# Patient Record
Sex: Male | Born: 1985 | Race: Black or African American | Hispanic: No | Marital: Married | Smoking: Never smoker
Health system: Southern US, Community
[De-identification: ages and names within clinical notes are randomized; demographics above are authoritative.]

## PROBLEM LIST (undated history)

## (undated) DIAGNOSIS — R030 Elevated blood-pressure reading, without diagnosis of hypertension: Secondary | ICD-10-CM

## (undated) DIAGNOSIS — L659 Nonscarring hair loss, unspecified: Secondary | ICD-10-CM

## (undated) DIAGNOSIS — J45909 Unspecified asthma, uncomplicated: Secondary | ICD-10-CM

## (undated) HISTORY — PX: WISDOM TOOTH EXTRACTION: SHX21

## (undated) HISTORY — DX: Unspecified asthma, uncomplicated: J45.909

## (undated) HISTORY — DX: Elevated blood-pressure reading, without diagnosis of hypertension: R03.0

## (undated) HISTORY — DX: Nonscarring hair loss, unspecified: L65.9

---
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
## 2009-01-02 ENCOUNTER — Emergency Department: Payer: Self-pay | Admitting: Emergency Medicine

## 2013-10-09 ENCOUNTER — Ambulatory Visit: Payer: Self-pay | Admitting: Family Medicine

## 2013-11-02 ENCOUNTER — Ambulatory Visit: Payer: Self-pay | Admitting: Family Medicine

## 2013-12-03 ENCOUNTER — Ambulatory Visit: Payer: Self-pay | Admitting: Family Medicine

## 2014-01-18 ENCOUNTER — Emergency Department: Payer: Self-pay | Admitting: Emergency Medicine

## 2015-05-04 DIAGNOSIS — J45909 Unspecified asthma, uncomplicated: Secondary | ICD-10-CM | POA: Insufficient documentation

## 2015-05-05 ENCOUNTER — Encounter: Payer: Self-pay | Admitting: Family Medicine

## 2015-05-05 ENCOUNTER — Ambulatory Visit (INDEPENDENT_AMBULATORY_CARE_PROVIDER_SITE_OTHER): Payer: 59 | Admitting: Family Medicine

## 2015-05-05 VITALS — BP 102/71 | HR 78 | Temp 98.5°F | Ht 65.5 in | Wt 201.0 lb

## 2015-05-05 DIAGNOSIS — M25561 Pain in right knee: Secondary | ICD-10-CM

## 2015-05-05 DIAGNOSIS — J4520 Mild intermittent asthma, uncomplicated: Secondary | ICD-10-CM

## 2015-05-05 MED ORDER — MELOXICAM 15 MG PO TABS: 15.0000 mg | ORAL_TABLET | Freq: Every day | ORAL | Status: DC

## 2015-05-05 NOTE — Progress Notes (Signed)
   BP 102/71 mmHg  Pulse 78  Temp(Src) 98.5 F (36.9 C)  Ht 5' 5.5" (1.664 m)  Wt 201 lb (91.173 kg)  BMI 32.93 kg/m2  SpO2 99%   Subjective:    Patient ID: Brandon Marsh, male    DOB: [birth year], 29 y.o.   MRN: [id number]  HPI: Brandon RobinsonGregory A Marsh is a 29 y.o. male presenting on 05/05/2015 for Knee Pain  Knee Pain: Patient presents with knee pain involving the  left knee. Onset of the symptoms was a week ago. Inciting event: none known. Current symptoms include stiffness. Pain is aggravated by inactivity.  Patient has had prior knee problems.  Some locking and occ giving way esp after getting out of car Relevant past medical, surgical, family and social history reviewed and updated as indicated. Interim medical history since our last visit reviewed. Allergies and medications reviewed and updated.  Current Outpatient Prescriptions on File Prior to Visit  Medication Sig  . albuterol (PROVENTIL HFA;VENTOLIN HFA) 108 (90 BASE) MCG/ACT inhaler Inhale into the lungs every 6 (six) hours as needed for wheezing or shortness of breath.  . budesonide-formoterol (SYMBICORT) 160-4.5 MCG/ACT inhaler Inhale 2 puffs into the lungs 2 (two) times daily.  . salmeterol (SEREVENT) 50 MCG/DOSE diskus inhaler Inhale 1 puff into the lungs 2 (two) times daily.   No current facility-administered medications on file prior to visit.    Review of Systems  Constitutional: Negative.   Respiratory: Negative.   Cardiovascular: Negative.     Per HPI unless specifically indicated above     Objective:    BP 102/71 mmHg  Pulse 78  Temp(Src) 98.5 F (36.9 C)  Ht 5' 5.5" (1.664 m)  Wt 201 lb (91.173 kg)  BMI 32.93 kg/m2  SpO2 99%  Wt Readings from Last 3 Encounters:  05/05/15 201 lb (91.173 kg)  09/20/14 199 lb (90.266 kg)    Physical Exam  Constitutional: He is oriented to person, place, and time. He appears well-developed and well-nourished. No distress.  HENT:  Head: Normocephalic and  atraumatic.  Right Ear: Hearing normal.  Left Ear: Hearing normal.  Nose: Nose normal.  Eyes: Conjunctivae and lids are normal. Right eye exhibits no discharge. Left eye exhibits no discharge. No scleral icterus.  Pulmonary/Chest: Effort normal. No respiratory distress.  Musculoskeletal: Normal range of motion.       Right knee: He exhibits normal range of motion, no swelling, no effusion, no ecchymosis, no deformity, no erythema, normal alignment, no LCL laxity and normal patellar mobility. Tenderness found. No MCL and no LCL tenderness noted.       Legs: Neurological: He is alert and oriented to person, place, and time.  Skin: Skin is intact. No rash noted.  Psychiatric: He has a normal mood and affect. His speech is normal and behavior is normal. Judgment and thought content normal. Cognition and memory are normal.    No results found for this or any previous visit.    Assessment & Plan:   Problem List Items Addressed This Visit      Other   Knee pain, right - Primary       Meds ordered this encounter  Medications  . meloxicam (MOBIC) 15 MG tablet    Sig: Take 1 tablet (15 mg total) by mouth daily.    Dispense:  30 tablet    Refill:  2    Follow up plan: No Follow-up on file.

## 2015-05-05 NOTE — Assessment & Plan Note (Signed)
Will cont med as doing well

## 2015-05-05 NOTE — Assessment & Plan Note (Signed)
Discussed care and use of meds Slow down on activity If not better ortho referral

## 2015-09-05 ENCOUNTER — Encounter: Payer: 59 | Admitting: Family Medicine

## 2015-09-06 ENCOUNTER — Telehealth: Payer: Self-pay | Admitting: Family Medicine

## 2015-09-06 NOTE — Telephone Encounter (Signed)
Pt forgot about appt yesterday and came this morning to reschedule and now has an appt the 25th of October. He would like a refill on his symbicort and his his albuterol rescue inhaler sent to [facility]

## 2015-09-07 MED ORDER — ALBUTEROL SULFATE HFA 108 (90 BASE) MCG/ACT IN AERS: 1.0000 | INHALATION_SPRAY | Freq: Four times a day (QID) | RESPIRATORY_TRACT | Status: DC | PRN

## 2015-09-07 MED ORDER — BUDESONIDE-FORMOTEROL FUMARATE 160-4.5 MCG/ACT IN AERO: 2.0000 | INHALATION_SPRAY | Freq: Two times a day (BID) | RESPIRATORY_TRACT | Status: DC

## 2015-09-27 ENCOUNTER — Ambulatory Visit (INDEPENDENT_AMBULATORY_CARE_PROVIDER_SITE_OTHER): Payer: BLUE CROSS/BLUE SHIELD | Admitting: Family Medicine

## 2015-09-27 ENCOUNTER — Encounter: Payer: Self-pay | Admitting: Family Medicine

## 2015-09-27 VITALS — BP 103/71 | HR 85 | Temp 98.2°F | Ht 64.7 in | Wt 199.0 lb

## 2015-09-27 DIAGNOSIS — J4520 Mild intermittent asthma, uncomplicated: Secondary | ICD-10-CM

## 2015-09-27 DIAGNOSIS — Z0000 Encounter for general adult medical examination without abnormal findings: Secondary | ICD-10-CM

## 2015-09-27 NOTE — Progress Notes (Signed)
   BP 103/71 mmHg  Pulse 85  Temp(Src) 98.2 F (36.8 C)  Ht 5' 4.7" (1.643 m)  Wt 199 lb (90.266 kg)  BMI 33.44 kg/m2  SpO2 98%   Subjective:    Patient ID: Brandon Marsh, male    DOB: 1986/04/18, 29 y.o.   MRN: [id number]  HPI: Brandon Marsh is a 29 y.o. male  Chief Complaint  Patient presents with  . Annual Exam   Patient doing some running breathing is doing okay taking Symbicort every day Rare use of albuterol. Not able to lose weight weight same as it was a year ago. Knee is better with some stretching Occasionally knee pain takes occasional meloxicam. Relevant past medical, surgical, family and social history reviewed and updated as indicated. Interim medical history since our last visit reviewed. Allergies and medications reviewed and updated.  Review of Systems  Constitutional: Negative.   HENT: Negative.   Eyes: Negative.   Respiratory: Negative.   Cardiovascular: Negative.   Gastrointestinal: Negative.   Endocrine: Negative.   Genitourinary: Negative.   Musculoskeletal: Negative.   Skin: Negative.   Allergic/Immunologic: Negative.   Neurological: Negative.   Hematological: Negative.   Psychiatric/Behavioral: Negative.     Per HPI unless specifically indicated above     Objective:    BP 103/71 mmHg  Pulse 85  Temp(Src) 98.2 F (36.8 C)  Ht 5' 4.7" (1.643 m)  Wt 199 lb (90.266 kg)  BMI 33.44 kg/m2  SpO2 98%  Wt Readings from Last 3 Encounters:  09/27/15 199 lb (90.266 kg)  05/05/15 201 lb (91.173 kg)  09/20/14 199 lb (90.266 kg)    Physical Exam  Constitutional: He is oriented to person, place, and time. He appears well-developed and well-nourished.  HENT:  Head: Normocephalic.  Right Ear: External ear normal.  Left Ear: External ear normal.  Nose: Nose normal.  Eyes: Conjunctivae and EOM are normal. Pupils are equal, round, and reactive to light.  Neck: Normal range of motion. Neck supple. No thyromegaly present.   Cardiovascular: Normal rate, regular rhythm, normal heart sounds and intact distal pulses.   Pulmonary/Chest: Effort normal and breath sounds normal.  Abdominal: Soft. Bowel sounds are normal. There is no splenomegaly or hepatomegaly.  Genitourinary: Penis normal.  Musculoskeletal: Normal range of motion.  Lymphadenopathy:    He has no cervical adenopathy.  Neurological: He is alert and oriented to person, place, and time. He has normal reflexes.  Skin: Skin is warm and dry.  Psychiatric: He has a normal mood and affect. His behavior is normal. Judgment and thought content normal.    No results found for this or any previous visit.    Assessment & Plan:   Problem List Items Addressed This Visit      Respiratory   Asthma - Primary    The current medical regimen is effective;  continue present plan and medications.        Other Visit Diagnoses    PE (physical exam), annual        Relevant Orders    CBC with Differential/Platelet    Lipid panel    Comprehensive metabolic panel    Urinalysis, Routine w reflex microscopic (not at [facility])    TSH       Discussed today fast diet Follow up plan: Return in about 1 year (around 09/26/2016).

## 2015-09-27 NOTE — Assessment & Plan Note (Signed)
The current medical regimen is effective;  continue present plan and medications.  

## 2015-09-28 ENCOUNTER — Encounter: Payer: Self-pay | Admitting: Family Medicine

## 2015-09-28 LAB — CBC WITH DIFFERENTIAL/PLATELET
BASOS ABS: 0 10*3/uL (ref 0.0–0.2)
Basos: 0 %
EOS (ABSOLUTE): 0.5 10*3/uL — ABNORMAL HIGH (ref 0.0–0.4)
Eos: 5 %
Hematocrit: 45.9 % (ref 37.5–51.0)
Hemoglobin: 15.6 g/dL (ref 12.6–17.7)
Immature Grans (Abs): 0 10*3/uL (ref 0.0–0.1)
Immature Granulocytes: 0 %
LYMPHS ABS: 4.8 10*3/uL — AB (ref 0.7–3.1)
Lymphs: 49 %
MCH: 28.7 pg (ref 26.6–33.0)
MCHC: 34 g/dL (ref 31.5–35.7)
MCV: 84 fL (ref 79–97)
MONOS ABS: 0.7 10*3/uL (ref 0.1–0.9)
Monocytes: 7 %
Neutrophils Absolute: 3.8 10*3/uL (ref 1.4–7.0)
Neutrophils: 39 %
Platelets: 281 10*3/uL (ref 150–379)
RBC: 5.44 x10E6/uL (ref 4.14–5.80)
RDW: 14 % (ref 12.3–15.4)
WBC: 9.8 10*3/uL (ref 3.4–10.8)

## 2015-09-28 LAB — COMPREHENSIVE METABOLIC PANEL
Albumin/Globulin Ratio: 1.8 (ref 1.1–2.5)
Globulin, Total: 2.6 g/dL (ref 1.5–4.5)
TOTAL PROTEIN: 7.3 g/dL (ref 6.0–8.5)

## 2015-09-28 LAB — URINALYSIS, ROUTINE W REFLEX MICROSCOPIC
Bilirubin, UA: NEGATIVE
Glucose, UA: NEGATIVE
KETONES UA: NEGATIVE
LEUKOCYTES UA: NEGATIVE
Nitrite, UA: NEGATIVE
Protein, UA: NEGATIVE
SPEC GRAV UA: 1.025 (ref 1.005–1.030)
Urobilinogen, Ur: 0.2 mg/dL (ref 0.2–1.0)
pH, UA: 5.5 (ref 5.0–7.5)

## 2015-09-28 LAB — LIPID PANEL
CHOLESTEROL TOTAL: 202 mg/dL — AB (ref 100–199)
Chol/HDL Ratio: 4.8 ratio units (ref 0.0–5.0)
HDL: 42 mg/dL (ref >39)
LDL Calculated: 101 mg/dL — ABNORMAL HIGH (ref 0–99)
Triglycerides: 296 mg/dL — ABNORMAL HIGH (ref 0–149)

## 2015-09-28 LAB — MICROSCOPIC EXAMINATION
Epithelial Cells (non renal): NONE SEEN /hpf (ref 0–10)
RBC, UA: NONE SEEN /hpf (ref 0–?)
WBC, UA: NONE SEEN /hpf (ref 0–?)

## 2015-09-28 LAB — TSH: TSH: 1.84 u[IU]/mL (ref 0.450–4.500)

## 2016-03-21 ENCOUNTER — Ambulatory Visit (INDEPENDENT_AMBULATORY_CARE_PROVIDER_SITE_OTHER): Payer: 59 | Admitting: Family Medicine

## 2016-03-21 ENCOUNTER — Encounter: Payer: Self-pay | Admitting: Family Medicine

## 2016-03-21 VITALS — BP 126/84 | HR 81 | Temp 98.6°F | Ht 65.1 in | Wt 201.0 lb

## 2016-03-21 DIAGNOSIS — M25561 Pain in right knee: Secondary | ICD-10-CM

## 2016-03-21 DIAGNOSIS — J4520 Mild intermittent asthma, uncomplicated: Secondary | ICD-10-CM

## 2016-03-21 MED ORDER — MELOXICAM 15 MG PO TABS: 15.0000 mg | ORAL_TABLET | Freq: Every day | ORAL | Status: DC

## 2016-03-21 NOTE — Assessment & Plan Note (Signed)
The current medical regimen is effective;  continue present plan and medications.  

## 2016-03-21 NOTE — Progress Notes (Signed)
BP 126/84 mmHg  Pulse 81  Temp(Src) 98.6 F (37 C)  Ht 5' 5.1" (1.654 m)  Wt 201 lb (91.173 kg)  BMI 33.33 kg/m2  SpO2 99%   Subjective:    Patient ID: Brandon Marsh, male    DOB: 1986/06/29, 30 y.o.   MRN: [id number]  HPI: Brandon Marsh is a 30 y.o. male  Chief Complaint  Patient presents with  . Knee Pain    right  Almost a year ago patient first hurt his knee doing some trapeze fine on vacation. Knee is intermittently hurt with some clicking popping sensation no giving way no locking.HAs done well with occasional meloxicam . Knee will do well for some time and then spontaneously just wake up and his knees sore and limping. As tried to give it time and it just isn't getting better.  Taking Zyrtec-D for allergies is in Symbicort every day hasn't needed albuterol as well as doing well with good control here in this allergy season.   Relevant past medical, surgical, family and social history reviewed and updated as indicated. Interim medical history since our last visit reviewed. Allergies and medications reviewed and updated.  Review of Systems  Constitutional: Negative.   Respiratory: Negative.   Cardiovascular: Negative.     Per HPI unless specifically indicated above     Objective:    BP 126/84 mmHg  Pulse 81  Temp(Src) 98.6 F (37 C)  Ht 5' 5.1" (1.654 m)  Wt 201 lb (91.173 kg)  BMI 33.33 kg/m2  SpO2 99%  Wt Readings from Last 3 Encounters:  03/21/16 201 lb (91.173 kg)  09/27/15 199 lb (90.266 kg)  05/05/15 201 lb (91.173 kg)    Physical Exam  Constitutional: He is oriented to person, place, and time. He appears well-developed and well-nourished. No distress.  HENT:  Head: Normocephalic and atraumatic.  Right Ear: Hearing normal.  Left Ear: Hearing normal.  Nose: Nose normal.  Eyes: Conjunctivae and lids are normal. Right eye exhibits no discharge. Left eye exhibits no discharge. No scleral icterus.  Cardiovascular: Normal rate and normal  heart sounds.   Pulmonary/Chest: Effort normal and breath sounds normal. No respiratory distress.  Musculoskeletal: Normal range of motion.  Knee exam with no swelling no joint line tenderness noted joint laxity full range of motion  Neurological: He is alert and oriented to person, place, and time.  Skin: Skin is intact. No rash noted.  Psychiatric: He has a normal mood and affect. His speech is normal and behavior is normal. Judgment and thought content normal. Cognition and memory are normal.    Results for orders placed or performed in visit on 09/27/15  Microscopic Examination  Result Value Ref Range   WBC, UA None seen 0 -  5 /hpf   RBC, UA None seen 0 -  2 /hpf   Epithelial Cells (non renal) None seen 0 - 10 /hpf  CBC with Differential/Platelet  Result Value Ref Range   WBC 9.8 3.4 - 10.8 x10E3/uL   RBC 5.44 4.14 - 5.80 x10E6/uL   Hemoglobin 15.6 12.6 - 17.7 g/dL   Hematocrit 40.9 81.1 - 51.0 %   MCV 84 79 - 97 fL   MCH 28.7 26.6 - 33.0 pg   MCHC 34.0 31.5 - 35.7 g/dL   RDW 91.4 78.2 - 95.6 %   Platelets 281 150 - 379 x10E3/uL   Neutrophils 39 %   Lymphs 49 %   Monocytes 7 %   Eos 5 %  Basos 0 %   Neutrophils Absolute 3.8 1.4 - 7.0 x10E3/uL   Lymphocytes Absolute 4.8 (H) 0.7 - 3.1 x10E3/uL   Monocytes Absolute 0.7 0.1 - 0.9 x10E3/uL   EOS (ABSOLUTE) 0.5 (H) 0.0 - 0.4 x10E3/uL   Basophils Absolute 0.0 0.0 - 0.2 x10E3/uL   Immature Granulocytes 0 %   Immature Grans (Abs) 0.0 0.0 - 0.1 x10E3/uL  Lipid panel  Result Value Ref Range   Cholesterol, Total 202 (H) 100 - 199 mg/dL   Triglycerides 562296 (H) 0 - 149 mg/dL   HDL 42 >13>39 mg/dL   LDL Calculated 086101 (H) 0 - 99 mg/dL   Chol/HDL Ratio 4.8 0.0 - 5.0 ratio units  Comprehensive metabolic panel  Result Value Ref Range   Total Protein 7.3 6.0 - 8.5 g/dL   Globulin, Total 2.6 1.5 - 4.5 g/dL   Albumin/Globulin Ratio 1.8 1.1 - 2.5  Urinalysis, Routine w reflex microscopic (not at [facility])  Result Value Ref Range    Specific Gravity, UA 1.025 1.005 - 1.030   pH, UA 5.5 5.0 - 7.5   Color, UA Yellow Yellow   Appearance Ur Clear Clear   Leukocytes, UA Negative Negative   Protein, UA Negative Negative/Trace   Glucose, UA Negative Negative   Ketones, UA Negative Negative   RBC, UA Trace (A) Negative   Bilirubin, UA Negative Negative   Urobilinogen, Ur 0.2 0.2 - 1.0 mg/dL   Nitrite, UA Negative Negative   Microscopic Examination See below:   TSH  Result Value Ref Range   TSH 1.840 0.450 - 4.500 uIU/mL      Assessment & Plan:   Problem List Items Addressed This Visit      Respiratory   Asthma - Primary    The current medical regimen is effective;  continue present plan and medications.         Other   Knee pain, right    Possible torn cartilage causing recurrent prolonged symptoms will refer to orthopedics to further evaluate      Relevant Medications   meloxicam (MOBIC) 15 MG tablet   Other Relevant Orders   Ambulatory referral to Orthopedic Surgery       Follow up plan: Return in about 6 months (around 09/20/2016) for Physical Exam.

## 2016-03-21 NOTE — Assessment & Plan Note (Signed)
Possible torn cartilage causing recurrent prolonged symptoms will refer to orthopedics to further evaluate

## 2016-07-02 ENCOUNTER — Other Ambulatory Visit: Payer: Self-pay | Admitting: Family Medicine

## 2016-08-17 ENCOUNTER — Encounter: Payer: Self-pay | Admitting: Family Medicine

## 2016-09-27 ENCOUNTER — Encounter: Payer: 59 | Admitting: Family Medicine

## 2017-05-15 ENCOUNTER — Other Ambulatory Visit: Payer: Self-pay | Admitting: Family Medicine

## 2017-05-15 DIAGNOSIS — M25561 Pain in right knee: Secondary | ICD-10-CM

## 2017-05-15 NOTE — Telephone Encounter (Signed)
Call pt, Met him know it's been over a year from his last office visit and we are unable to call in any medication. He can take over-the-counter meloxicam like medication such as Advil or Aleve. Otherwise he will need to make an appointment. Someone in this office other than me can see him this week

## 2017-05-15 NOTE — Telephone Encounter (Signed)
Left message on machine for pt to return call to the office.  

## 2017-08-19 ENCOUNTER — Other Ambulatory Visit: Payer: Self-pay | Admitting: Family Medicine

## 2017-08-19 NOTE — Telephone Encounter (Signed)
Routing to provider. No follow up on file. 

## 2017-10-09 ENCOUNTER — Encounter: Payer: Self-pay | Admitting: Family Medicine

## 2017-10-09 ENCOUNTER — Ambulatory Visit: Payer: BC Managed Care – PPO | Admitting: Family Medicine

## 2017-10-09 VITALS — BP 158/84 | HR 100 | Temp 98.8°F | Wt 194.0 lb

## 2017-10-09 DIAGNOSIS — J4521 Mild intermittent asthma with (acute) exacerbation: Secondary | ICD-10-CM | POA: Diagnosis not present

## 2017-10-09 MED ORDER — PREDNISONE 10 MG PO TABS: ORAL_TABLET | ORAL | 0 refills | Status: DC

## 2017-10-09 MED ORDER — CETIRIZINE-PSEUDOEPHEDRINE ER 5-120 MG PO TB12: 1.0000 | ORAL_TABLET | Freq: Two times a day (BID) | ORAL | 11 refills | Status: DC

## 2017-10-09 MED ORDER — BUDESONIDE-FORMOTEROL FUMARATE 160-4.5 MCG/ACT IN AERO: 2.0000 | INHALATION_SPRAY | Freq: Two times a day (BID) | RESPIRATORY_TRACT | 12 refills | Status: DC

## 2017-10-09 MED ORDER — AZITHROMYCIN 250 MG PO TABS: ORAL_TABLET | ORAL | 0 refills | Status: DC

## 2017-10-09 NOTE — Progress Notes (Signed)
   BP (!) 158/84   Pulse 100   Temp 98.8 F (37.1 C) (Oral)   Wt 194 lb (88 kg)   SpO2 95%   BMI 32.18 kg/m    Subjective:    Patient ID: Brandon Marsh, male    DOB: 09-05-1986, 31 y.o.   MRN: [id number]  HPI: Brandon RobinsonGregory A Tabb is a 31 y.o. male  Chief Complaint  Patient presents with  . URI    Beginning of October, worse at night.   . Sore Throat    Oct. 30th   Intermittent cough x 1 month, 2 weeks now of persistent productive cough and sore throat. Keeping him up at night. Does have a hx of asthma. Taking symbicort daily, albuterol prn. Also taking mucinex which helped temporarily. Denies fevers, chills, body aches.   Relevant past medical, surgical, family and social history reviewed and updated as indicated. Interim medical history since our last visit reviewed. Allergies and medications reviewed and updated.  Review of Systems  Constitutional: Negative.   HENT: Positive for congestion and sore throat.   Eyes: Negative.   Respiratory: Positive for cough, chest tightness and wheezing.   Cardiovascular: Negative.   Gastrointestinal: Negative.   Musculoskeletal: Negative.   Neurological: Negative.   Psychiatric/Behavioral: Negative.     Per HPI unless specifically indicated above     Objective:    BP (!) 158/84   Pulse 100   Temp 98.8 F (37.1 C) (Oral)   Wt 194 lb (88 kg)   SpO2 95%   BMI 32.18 kg/m   Wt Readings from Last 3 Encounters:  10/09/17 194 lb (88 kg)  03/21/16 201 lb (91.2 kg)  09/27/15 199 lb (90.3 kg)    Physical Exam  Constitutional: He is oriented to person, place, and time. He appears well-developed and well-nourished. No distress.  HENT:  Head: Atraumatic.  Right Ear: External ear normal.  Left Ear: External ear normal.  Nose: Nose normal.  Mouth/Throat: No oropharyngeal exudate.  Oropharynx erythematous without exudates  Eyes: Conjunctivae are normal. Pupils are equal, round, and reactive to light.  Neck: Normal range of  motion. Neck supple.  Cardiovascular: Normal rate and normal heart sounds.  Pulmonary/Chest: Effort normal. No respiratory distress. He has wheezes. He has no rales.  Musculoskeletal: Normal range of motion.  Neurological: He is alert and oriented to person, place, and time.  Skin: Skin is warm and dry.  Psychiatric: He has a normal mood and affect. His behavior is normal.  Nursing note and vitals reviewed.     Assessment & Plan:   Problem List Items Addressed This Visit      Respiratory   Asthma - Primary    Will start zpack, prednisone taper, and daily zyrtec D to keep allergies at bay. Continue inhaler regimen. F/u if worsening or no improvement.       Relevant Medications   budesonide-formoterol (SYMBICORT) 160-4.5 MCG/ACT inhaler   predniSONE (DELTASONE) 10 MG tablet       Follow up plan: Return if symptoms worsen or fail to improve.

## 2017-10-11 NOTE — Assessment & Plan Note (Signed)
Will start zpack, prednisone taper, and daily zyrtec D to keep allergies at bay. Continue inhaler regimen. F/u if worsening or no improvement.

## 2017-10-11 NOTE — Patient Instructions (Signed)
Follow up as needed

## 2018-06-19 ENCOUNTER — Other Ambulatory Visit: Payer: Self-pay | Admitting: Family Medicine

## 2018-06-19 NOTE — Telephone Encounter (Signed)
Your patient 

## 2018-09-06 ENCOUNTER — Other Ambulatory Visit: Payer: Self-pay | Admitting: Family Medicine

## 2018-09-08 NOTE — Telephone Encounter (Signed)
[facility] called and spoke to Toniann Fail, Pensions consultant about the [employer] inhaler. I advised the message on the rx request protocol that says:  One inhaler should last at least one month. If the patient is requesting refills earlier, contact the patient to check for uncontrolled symptoms.  Toniann Fail says the patient received 1 inhaler last in July. Medication will be refilled.

## 2018-10-08 ENCOUNTER — Encounter: Payer: Self-pay | Admitting: Family Medicine

## 2018-10-08 ENCOUNTER — Ambulatory Visit: Payer: BC Managed Care – PPO | Admitting: Family Medicine

## 2018-10-08 VITALS — BP 112/81 | HR 82 | Temp 99.3°F | Ht 66.0 in | Wt 216.7 lb

## 2018-10-08 DIAGNOSIS — J029 Acute pharyngitis, unspecified: Secondary | ICD-10-CM

## 2018-10-08 DIAGNOSIS — R52 Pain, unspecified: Secondary | ICD-10-CM | POA: Diagnosis not present

## 2018-10-08 DIAGNOSIS — J019 Acute sinusitis, unspecified: Secondary | ICD-10-CM | POA: Diagnosis not present

## 2018-10-08 MED ORDER — AMOXICILLIN 875 MG PO TABS: 875.0000 mg | ORAL_TABLET | Freq: Two times a day (BID) | ORAL | 0 refills | Status: DC

## 2018-10-08 NOTE — Progress Notes (Signed)
BP 112/81 (BP Location: Left Arm, Patient Position: Sitting, Cuff Size: Normal)   Pulse 82   Temp 99.3 F (37.4 C) (Oral)   Ht 5\' 6"  (1.676 m)   Wt 216 lb 11.2 oz (98.3 kg)   SpO2 97%   BMI 34.98 kg/m    Subjective:    Patient ID: Hubbard Robinson, male    DOB: 11/02/1986, 31 y.o.   MRN: [id number]  HPI: MUATH HALLAM is a 32 y.o. male  Chief Complaint  Patient presents with  . Cough    Productive. Patient states he's coughing up green/blood.  . Sore Throat  . Nasal Congestion  Patient's been sick with sinus congestion facial pain tenderness sloshing type sensation in his head for up to a week.  Said some low-grade fevers generalized achiness and feeling bad.  Has tried some over-the-counter medications without success  Relevant past medical, surgical, family and social history reviewed and updated as indicated. Interim medical history since our last visit reviewed. Allergies and medications reviewed and updated.  Review of Systems  Constitutional: Negative.   Respiratory: Negative.   Cardiovascular: Negative.     Per HPI unless specifically indicated above     Objective:    BP 112/81 (BP Location: Left Arm, Patient Position: Sitting, Cuff Size: Normal)   Pulse 82   Temp 99.3 F (37.4 C) (Oral)   Ht 5\' 6"  (1.676 m)   Wt 216 lb 11.2 oz (98.3 kg)   SpO2 97%   BMI 34.98 kg/m   Wt Readings from Last 3 Encounters:  10/08/18 216 lb 11.2 oz (98.3 kg)  10/09/17 194 lb (88 kg)  03/21/16 201 lb (91.2 kg)    Physical Exam  Constitutional: He is oriented to person, place, and time. He appears well-developed and well-nourished.  HENT:  Head: Normocephalic and atraumatic.  Eyes: Conjunctivae and EOM are normal.  Neck: Normal range of motion.  Cardiovascular: Normal rate, regular rhythm and normal heart sounds.  Pulmonary/Chest: Effort normal and breath sounds normal.  Musculoskeletal: Normal range of motion.  Neurological: He is alert and oriented to person,  place, and time.  Skin: No erythema.  Psychiatric: He has a normal mood and affect. His behavior is normal. Judgment and thought content normal.    Results for orders placed or performed in visit on 09/27/15  Microscopic Examination  Result Value Ref Range   WBC, UA None seen 0 - 5 /hpf   RBC, UA None seen 0 - 2 /hpf   Epithelial Cells (non renal) None seen 0 - 10 /hpf  CBC with Differential/Platelet  Result Value Ref Range   WBC 9.8 3.4 - 10.8 x10E3/uL   RBC 5.44 4.14 - 5.80 x10E6/uL   Hemoglobin 15.6 12.6 - 17.7 g/dL   Hematocrit 40.9 81.1 - 51.0 %   MCV 84 79 - 97 fL   MCH 28.7 26.6 - 33.0 pg   MCHC 34.0 31.5 - 35.7 g/dL   RDW 91.4 78.2 - 95.6 %   Platelets 281 150 - 379 x10E3/uL   Neutrophils 39 %   Lymphs 49 %   Monocytes 7 %   Eos 5 %   Basos 0 %   Neutrophils Absolute 3.8 1.4 - 7.0 x10E3/uL   Lymphocytes Absolute 4.8 (H) 0.7 - 3.1 x10E3/uL   Monocytes Absolute 0.7 0.1 - 0.9 x10E3/uL   EOS (ABSOLUTE) 0.5 (H) 0.0 - 0.4 x10E3/uL   Basophils Absolute 0.0 0.0 - 0.2 x10E3/uL   Immature Granulocytes 0 %  Immature Grans (Abs) 0.0 0.0 - 0.1 x10E3/uL  Lipid panel  Result Value Ref Range   Cholesterol, Total 202 (H) 100 - 199 mg/dL   Triglycerides 696 (H) 0 - 149 mg/dL   HDL 42 >29 mg/dL   LDL Calculated 528 (H) 0 - 99 mg/dL   Chol/HDL Ratio 4.8 0.0 - 5.0 ratio units  Comprehensive metabolic panel  Result Value Ref Range   Total Protein 7.3 6.0 - 8.5 g/dL   Globulin, Total 2.6 1.5 - 4.5 g/dL   Albumin/Globulin Ratio 1.8 1.1 - 2.5  Urinalysis, Routine w reflex microscopic (not at [facility])  Result Value Ref Range   Specific Gravity, UA 1.025 1.005 - 1.030   pH, UA 5.5 5.0 - 7.5   Color, UA Yellow Yellow   Appearance Ur Clear Clear   Leukocytes, UA Negative Negative   Protein, UA Negative Negative/Trace   Glucose, UA Negative Negative   Ketones, UA Negative Negative   RBC, UA Trace (A) Negative   Bilirubin, UA Negative Negative   Urobilinogen, Ur 0.2 0.2 - 1.0 mg/dL     Nitrite, UA Negative Negative   Microscopic Examination See below:   TSH  Result Value Ref Range   TSH 1.840 0.450 - 4.500 uIU/mL      Assessment & Plan:   Problem List Items Addressed This Visit    None    Visit Diagnoses    Sore throat    -  Primary   Relevant Orders   Rapid Strep Screen ([facility] ONLY)   Generalized body aches       Relevant Orders   Veritor Flu A/B Waived   Acute sinusitis, recurrence not specified, unspecified location       Relevant Medications   amoxicillin (AMOXIL) 875 MG tablet    Patient with sinusitis flu test and strep test were negative reviewed sinusitis care and treatment use of antibiotics and over-the-counter medications patient indicates he understands and will comply.  Discussed back to work and avoiding back to work until Monday as today is Wednesday. Follow up plan: Return if symptoms worsen or fail to improve, for As scheduled.

## 2018-10-13 ENCOUNTER — Ambulatory Visit: Payer: BC Managed Care – PPO | Admitting: Family Medicine

## 2018-10-13 ENCOUNTER — Encounter: Payer: Self-pay | Admitting: Family Medicine

## 2018-10-13 VITALS — BP 130/88 | HR 90 | Temp 99.0°F | Ht 65.0 in | Wt 216.0 lb

## 2018-10-13 DIAGNOSIS — J4521 Mild intermittent asthma with (acute) exacerbation: Secondary | ICD-10-CM

## 2018-10-13 DIAGNOSIS — J019 Acute sinusitis, unspecified: Secondary | ICD-10-CM | POA: Diagnosis not present

## 2018-10-13 DIAGNOSIS — E559 Vitamin D deficiency, unspecified: Secondary | ICD-10-CM

## 2018-10-13 DIAGNOSIS — Z6835 Body mass index (BMI) 35.0-35.9, adult: Secondary | ICD-10-CM | POA: Diagnosis not present

## 2018-10-13 LAB — CBC WITH DIFFERENTIAL/PLATELET
BASOS ABS: 0.1 10*3/uL (ref 0.0–0.1)
Basophils Relative: 0.7 % (ref 0.0–3.0)
EOS ABS: 0.4 10*3/uL (ref 0.0–0.7)
Eosinophils Relative: 4.6 % (ref 0.0–5.0)
HCT: 45.9 % (ref 39.0–52.0)
Hemoglobin: 15.7 g/dL (ref 13.0–17.0)
LYMPHS ABS: 4.2 10*3/uL — AB (ref 0.7–4.0)
LYMPHS PCT: 44.6 % (ref 12.0–46.0)
MCHC: 34.3 g/dL (ref 30.0–36.0)
MCV: 82.8 fl (ref 78.0–100.0)
MONO ABS: 0.6 10*3/uL (ref 0.1–1.0)
Monocytes Relative: 6.7 % (ref 3.0–12.0)
NEUTROS ABS: 4.1 10*3/uL (ref 1.4–7.7)
NEUTROS PCT: 43.4 % (ref 43.0–77.0)
PLATELETS: 304 10*3/uL (ref 150.0–400.0)
RBC: 5.54 Mil/uL (ref 4.22–5.81)
RDW: 13.2 % (ref 11.5–15.5)
WBC: 9.4 10*3/uL (ref 4.0–10.5)

## 2018-10-13 LAB — COMPREHENSIVE METABOLIC PANEL
ALBUMIN: 4.6 g/dL (ref 3.5–5.2)
ALK PHOS: 87 U/L (ref 39–117)
ALT: 42 U/L (ref 0–53)
AST: 25 U/L (ref 0–37)
BILIRUBIN TOTAL: 0.5 mg/dL (ref 0.2–1.2)
BUN: 8 mg/dL (ref 6–23)
CO2: 30 mEq/L (ref 19–32)
Calcium: 10.2 mg/dL (ref 8.4–10.5)
Chloride: 99 mEq/L (ref 96–112)
Creatinine, Ser: 0.99 mg/dL (ref 0.40–1.50)
GFR: 113.48 mL/min (ref >60.00)
Glucose, Bld: 93 mg/dL (ref 70–99)
Potassium: 4.4 mEq/L (ref 3.5–5.1)
SODIUM: 138 meq/L (ref 135–145)
Total Protein: 7.5 g/dL (ref 6.0–8.3)

## 2018-10-13 LAB — LIPID PANEL
CHOLESTEROL: 225 mg/dL — AB (ref 0–200)
HDL: 37.9 mg/dL — ABNORMAL LOW (ref >39.00)
Total CHOL/HDL Ratio: 6

## 2018-10-13 LAB — B12 AND FOLATE PANEL
Folate: 18.9 ng/mL (ref >5.9)
Vitamin B-12: 818 pg/mL (ref 211–911)

## 2018-10-13 LAB — TSH: TSH: 1.52 u[IU]/mL (ref 0.35–4.50)

## 2018-10-13 LAB — LDL CHOLESTEROL, DIRECT: LDL DIRECT: 123 mg/dL

## 2018-10-13 LAB — VITAMIN D 25 HYDROXY (VIT D DEFICIENCY, FRACTURES): VITD: 20.93 ng/mL — ABNORMAL LOW (ref 30.00–100.00)

## 2018-10-13 MED ORDER — PHENTERMINE HCL 30 MG PO CAPS: 30.0000 mg | ORAL_CAPSULE | ORAL | 0 refills | Status: DC

## 2018-10-13 NOTE — Progress Notes (Signed)
Subjective:    Patient ID: Brandon Marsh, male    DOB: September 01, 1986, 32 y.o.   MRN: [id number]  HPI  Patient presents to clinic to establish with PCP.  His main concern today is weight loss.  Patient states he was doing a weight loss program a couple of years ago, but it became too expensive to go to the specialty visits.  Patient is interested in taking a weight loss supplement for the first few months to help jumpstart his weight loss, and also will do diet exercise.  Patient states he notices when he is heavier, his breathing is worse.  He has a history of asthma.  He takes albuterol as needed and also Symbicort daily for stabilization.  Denies any breathing issues currently. He is currently on antibiotics to treat sinus infection.  Patient states his cold/sinus symptoms have improved since being on the antibiotic, first started on 10/08/2018.  Past Medical History:  Diagnosis Date  . Alopecia   . Asthma   . Elevated blood pressure reading without diagnosis of hypertension    Social History   Tobacco Use  . Smoking status: Never Smoker  . Smokeless tobacco: Never Used  Substance Use Topics  . Alcohol use: No    Alcohol/week: 0.0 standard drinks   Past Surgical History:  Procedure Laterality Date  . WISDOM TOOTH EXTRACTION     Family History  Problem Relation Age of Onset  . Asthma Father   . Hypertension Mother   . Diabetes Maternal Grandmother   . Hypertension Maternal Grandmother    Review of Systems  Constitutional: Negative for chills, fatigue and fever.  HENT: Negative for congestion, ear pain, sinus pain and sore throat.   Eyes: Negative.   Respiratory: Negative for cough, shortness of breath and wheezing.   Cardiovascular: Negative for chest pain, palpitations and leg swelling.  Gastrointestinal: Negative for abdominal pain, diarrhea, nausea and vomiting.  Genitourinary: Negative for dysuria, frequency and urgency.  Musculoskeletal: Negative for  arthralgias and myalgias.  Skin: Negative for color change, pallor and rash.  Neurological: Negative for syncope, light-headedness and headaches.  Psychiatric/Behavioral: The patient is not nervous/anxious.       Objective:   Physical Exam  Constitutional: He appears well-developed and well-nourished. No distress.  HENT:  Head: Normocephalic and atraumatic.  Eyes: Pupils are equal, round, and reactive to light. Conjunctivae and EOM are normal. No scleral icterus.  Neck: Normal range of motion. Neck supple. No tracheal deviation present.  Cardiovascular: Normal rate, regular rhythm and normal heart sounds.  Pulmonary/Chest: Effort normal and breath sounds normal. No respiratory distress. He has no wheezes. He has no rales.  Abdominal: Soft. Bowel sounds are normal. There is no tenderness.  Neurological: He is alert and oriented to person, place, and time. Gait normal  Skin: Skin is warm and dry. He is not diaphoretic. No pallor.  Psychiatric: He has a normal mood and affect. His behavior is normal. Thought content normal.   Nursing note and vitals reviewed.  Body mass index is 35.94 kg/m.   Vitals:   10/13/18 1021  BP: 130/88  Pulse: 90  Temp: 99 F (37.2 C)  SpO2: 97%      Assessment & Plan:   Obesity - discussed with patient that we can do phentermine for 3 months to help kick start weight loss.  Advised he must also do healthy diet and regular exercise while taking this medication, so he can continue to maintain good diet and regular exercise  after medication stops.  Patient aware that phentermine is a stimulant, advised to monitor self for any symptoms including racing heart, palpitations, chest pain, feeling short of breath.  Patient given handout outlining a lower carb, higher protein higher patient will diet plan that gives made her food choices that can be helpful to assist him in his weight loss journey.  Mild intermittent asthma without acute exacerbation  - asthma  stable at this time.  Currently does not require refills of Symbicort L or albuterol  Sinusitis-sinusitis symptoms are resolving.  Patient advised to finish full antibiotic course as prescribed.   Follow-up here in 1 month for monitoring of weight while on phentermine.

## 2018-10-13 NOTE — Patient Instructions (Signed)
This is  Dr. Tullo's  example of a  "Low GI"  Diet:  It will allow you to lose 4 to 8  lbs  per month if you follow it carefully.  Your goal with exercise is a minimum of 30 minutes of aerobic exercise 5 days per week (Walking does not count once it becomes easy!)    All of the foods can be found at grocery stores and in bulk at [employer].  The Atkins protein bars and shakes are available in more varieties at Target, WalMart and Lowe's Foods.     7 AM Breakfast:  Choose from the following:  Low carbohydrate Protein  Shakes (I recommend the  Premier Protein chocolate shakes,  EAS AdvantEdge "Carb Control" shakes  Or the Atkins shakes all are under 3 net carbs)     a scrambled egg/bacon/cheese burrito made with Mission's "carb balance" whole wheat tortilla  (about 10 net carbs )  Jimmy Deans sells microwaveable frittata (basically a quiche without the pastry crust) that is eaten cold and very convenient way to get your eggs.  8 carbs)  If you make your own protein shakes, avoid bananas and pineapple,  And use low carb greek yogurt or original /unsweetened almond or soy milk    Avoid cereal and bananas, oatmeal and cream of wheat and grits. They are loaded with carbohydrates!   10 AM: high protein snack:  Protein bar by Atkins (the snack size, under 200 cal, usually < 6 net carbs).    A stick of cheese:  Around 1 carb,  100 cal     Dannon Light n Fit Greek Yogurt  (80 cal, 8 carbs)  Other so called "protein bars" and Greek yogurts tend to be loaded with carbohydrates.  Remember, in food advertising, the word "energy" is synonymous for " carbohydrate."  Lunch:   A Sandwich using the bread choices listed, Can use any  Eggs,  lunchmeat, grilled meat or canned tuna), avocado, regular mayo/mustard  and cheese.  A Salad using blue cheese, ranch,  Goddess or vinagrette,  Avoid taco shells, croutons or "confetti" and no "candied nuts" but regular nuts OK.   No pretzels, nabs  or chips.  Pickles and  miniature sweet peppers are a good low carb alternative that provide a "crunch"  The bread is the only source of carbohydrate in a sandwich and  can be decreased by trying some of the attached alternatives to traditional loaf bread   Avoid "Low fat dressings, as well as [place] and [place] dressings They are loaded with sugar!   3 PM/ Mid day  Snack:  Consider  1 ounce of  almonds, walnuts, pistachios, pecans, peanuts,  Macadamia nuts or a nut medley.  Avoid "granola and granola bars "  Mixed nuts are ok in moderation as long as there are no raisins,  cranberries or dried fruit.   KIND bars are OK if you get the low glycemic index variety   Try the prosciutto/mozzarella cheese sticks by Fiorruci  In deli /backery section   High protein      6 PM  Dinner:     Meat/fowl/fish with a green salad, and either broccoli, cauliflower, green beans, spinach, brussel sprouts or  Lima beans. DO NOT BREAD THE PROTEIN!!      There is a low carb pasta by Dreamfield's that is acceptable and tastes great: only 5 digestible carbs/serving.( All grocery stores but BJs carry it ) Several ready made meals are   available low carb:   Try Michel Angelo's chicken piccata or chicken or eggplant parm over low carb pasta.(Lowes and BJs)   Aaron Sanchez's "Carnitas" (pulled pork, no sauce,  0 carbs) or his beef pot roast to make a dinner burrito (at BJ's)  Pesto over low carb pasta (bj's sells a good quality pesto in the center refrigerated section of the deli   Try satueeing  Bok Choy with mushroooms as a good side   Green Giant makes a mashed cauliflower that tastes like mashed potatoes  Whole wheat pasta is still full of digestible carbs and  Not as low in glycemic index as Dreamfield's.   Brown rice is still rice,  So skip the rice and noodles if you eat Chinese or Thai (or at least limit to 1/2 cup)  9 PM snack :   Breyer's "low carb" fudgsicle or  ice cream bar (Carb Smart line), or  Weight Watcher's ice  cream bar , or another "no sugar added" ice cream;  a serving of fresh berries/cherries with whipped cream   Cheese or DANNON'S LlGHT N FIT GREEK YOGURT  8 ounces of [place] unsweetened almond/cococunut milk    Treat yourself to a parfait made with whipped cream blueberiies, walnuts and vanilla greek yogurt  Avoid bananas, pineapple, grapes  and watermelon on a regular basis because they are high in sugar.  THINK OF THEM AS DESSERT  Remember that snack Substitutions should be less than 10 NET carbs per serving and meals < 20 carbs. Remember to subtract fiber grams to get the "net carbs."   

## 2018-10-16 MED ORDER — ERGOCALCIFEROL 50 MCG (2000 UT) PO TABS: 1.0000 | ORAL_TABLET | Freq: Every day | ORAL | 1 refills | Status: DC

## 2018-10-16 NOTE — Addendum Note (Signed)
Addended by: Leanora CoverGUSE,  on: 10/16/2018 01:10 PM   Modules accepted: Orders

## 2018-11-28 ENCOUNTER — Other Ambulatory Visit: Payer: Self-pay | Admitting: Family Medicine

## 2018-11-28 DIAGNOSIS — Z6835 Body mass index (BMI) 35.0-35.9, adult: Principal | ICD-10-CM

## 2018-12-08 NOTE — Telephone Encounter (Signed)
He needs follow up visit for weight check before we can do refill

## 2019-02-16 ENCOUNTER — Other Ambulatory Visit: Payer: Self-pay | Admitting: Family Medicine

## 2019-02-16 NOTE — Telephone Encounter (Signed)
Copied from CRM [id number]. Topic: Quick Communication - Rx Refill/Question >> Feb 16, 2019  4:45 PM Jolayne Haines L wrote: Medication: albuterol (PROVENTIL HFA;VENTOLIN HFA) 108 (90 Base) MCG/ACT inhaler - would like the name brand because the secondary brand makes him cough and makes his throat itch  Has the patient contacted their pharmacy? Yes (Agent: If no, request that the patient contact the pharmacy for the refill.) (Agent: If yes, when and what did the pharmacy advise?)  Preferred Pharmacy (with phone number or street name): [id number] [place], [place] - [address] AT [facility] OF [address] [facility] [address] [place] [address] Phone: [phone] Fax: [phone]    Agent: Please be advised that RX refills may take up to 3 business days. We ask that you follow-up with your pharmacy.

## 2019-02-17 NOTE — Telephone Encounter (Signed)
Please see patients request for Brand name instead of generic

## 2019-02-23 ENCOUNTER — Other Ambulatory Visit: Payer: Self-pay | Admitting: Family Medicine

## 2019-02-23 MED ORDER — ALBUTEROL SULFATE HFA 108 (90 BASE) MCG/ACT IN AERS: INHALATION_SPRAY | RESPIRATORY_TRACT | 0 refills | Status: DC

## 2019-02-27 ENCOUNTER — Other Ambulatory Visit: Payer: Self-pay

## 2019-02-27 MED ORDER — ALBUTEROL SULFATE HFA 108 (90 BASE) MCG/ACT IN AERS: INHALATION_SPRAY | RESPIRATORY_TRACT | 0 refills | Status: DC

## 2019-02-27 MED ORDER — BUDESONIDE-FORMOTEROL FUMARATE 160-4.5 MCG/ACT IN AERO: 2.0000 | INHALATION_SPRAY | Freq: Two times a day (BID) | RESPIRATORY_TRACT | 12 refills | Status: DC

## 2019-02-27 NOTE — Telephone Encounter (Signed)
Called and informed pt that he needs to f/up with you before he gets anymore of the diet medication, pt asked could he tell you his weight over the phone, I informed him that he needed to be seen so you can see how he is doing, but plase advise if something different he stated because of his asthma he did not want to come in but he would cal back later on to schedule.  Nina,cma

## 2019-02-27 NOTE — Telephone Encounter (Signed)
He was last seen in November 2019 and did not follow up as advised.    I will not refill this until he follows up.

## 2019-04-07 ENCOUNTER — Telehealth: Payer: Self-pay | Admitting: Family Medicine

## 2019-04-07 ENCOUNTER — Other Ambulatory Visit: Payer: Self-pay

## 2019-04-07 ENCOUNTER — Ambulatory Visit (INDEPENDENT_AMBULATORY_CARE_PROVIDER_SITE_OTHER): Payer: BLUE CROSS/BLUE SHIELD | Admitting: Family Medicine

## 2019-04-07 VITALS — Wt 223.0 lb

## 2019-04-07 DIAGNOSIS — F524 Premature ejaculation: Secondary | ICD-10-CM

## 2019-04-07 DIAGNOSIS — Z713 Dietary counseling and surveillance: Secondary | ICD-10-CM | POA: Diagnosis not present

## 2019-04-07 DIAGNOSIS — Z6835 Body mass index (BMI) 35.0-35.9, adult: Secondary | ICD-10-CM | POA: Diagnosis not present

## 2019-04-07 MED ORDER — PHENTERMINE HCL 30 MG PO CAPS: 30.0000 mg | ORAL_CAPSULE | ORAL | 0 refills | Status: DC

## 2019-04-07 NOTE — Telephone Encounter (Signed)
Please schedule him for labs on 04/14/2019 at 930 AM  And schedule 4 week follow up with me

## 2019-04-07 NOTE — Progress Notes (Signed)
Patient ID: Brandon Marsh, male   DOB: 31-Aug-1986, 33 y.o.   MRN: [id number]    Virtual Visit via video Note  This visit type was conducted due to national recommendations for restrictions regarding the COVID-19 pandemic (e.g. social distancing).  This format is felt to be most appropriate for this patient at this time.  All issues noted in this document were discussed and addressed.  No physical exam was performed (except for noted visual exam findings with Video Visits).   I connected with Suezanne Jacquet today at 11:00 AM EDT by a video enabled telemedicine application and verified that I am speaking with the correct person using two identifiers. Location patient: home Location provider: [facility] Persons participating in the virtual visit: patient, provider  I discussed the limitations, risks, security and privacy concerns of performing an evaluation and management service by video and the availability of in person appointments. I also discussed with the patient that there may be a patient responsible charge related to this service. The patient expressed understanding and agreed to proceed.  HPI:  Patient and I connected via video to discuss weight gain and getting back on a weight loss medication.  Patient had been seen here previously back in November 2019 and was started on phentermine to help jump start weight loss.  He did not follow-up at his 4-week follow-up appointment, so we did not renew prescription of phentermine for another 30-day supply.  Patient is concerned with his continued weight gain.  He is now 223 pounds.  Back in November he was 216 pounds.  Patient states he is not very active right now due to working from home and the gym is being closed.  Does admit he most likely is not eating as well as he should.  States he was trying to do a diet where he counted macros, but doing this he felt like he was in taking in more calories than previously.  Patient also reports  he is having some issues with premature ejaculation.  States he will get an erection and he and wife will be having sexual intercourse, but feels he comes due soon.  Wonders if this could be related to his weight or some other medical issue.  Otherwise he denies any fever or chills.  Denies shortness of breath or wheezing.  Denies chest pain or palpitations.  Denies nausea, vomiting or diarrhea.  Denies any urinary problems.  ROS: See pertinent positives and negatives per HPI.  Past Medical History:  Diagnosis Date  . Alopecia   . Asthma   . Elevated blood pressure reading without diagnosis of hypertension     Past Surgical History:  Procedure Laterality Date  . WISDOM TOOTH EXTRACTION      Family History  Problem Relation Age of Onset  . Asthma Father   . Hypertension Mother   . Diabetes Maternal Grandmother   . Hypertension Maternal Grandmother   . Alcohol abuse Paternal Grandmother    Social History   Tobacco Use  . Smoking status: Never Smoker  . Smokeless tobacco: Never Used  Substance Use Topics  . Alcohol use: Yes    Alcohol/week: 0.0 standard drinks    Current Outpatient Medications:  .  albuterol (PROVENTIL HFA;VENTOLIN HFA) 108 (90 Base) MCG/ACT inhaler, INHALE ONE PUFF EVERY 6 HOURS AS NEEDED WHEEZING, Disp: 8.5 Inhaler, Rfl: 0 .  albuterol (PROVENTIL HFA;VENTOLIN HFA) 108 (90 Base) MCG/ACT inhaler, INHALE ONE PUFF EVERY 6 HOURS AS NEEDED WHEEZING, Disp: 8.5 Inhaler, Rfl: 0 .  budesonide-formoterol (SYMBICORT) 160-4.5 MCG/ACT inhaler, Inhale 2 puffs into the lungs 2 (two) times daily., Disp: 1 Inhaler, Rfl: 12 .  Ergocalciferol 50 MCG (2000 UT) TABS, Take 1 tablet by mouth daily. (Patient not taking: Reported on 04/07/2019), Disp: 90 tablet, Rfl: 1 .  phentermine 30 MG capsule, Take 1 capsule (30 mg total) by mouth every morning., Disp: 30 capsule, Rfl: 0  EXAM:  Wt Readings from Last 3 Encounters:  04/07/19 223 lb (101.2 kg)  10/13/18 216 lb (98 kg)  10/08/18  216 lb 11.2 oz (98.3 kg)   GENERAL: alert, oriented, appears well and in no acute distress  HEENT: atraumatic, conjunttiva clear, no obvious abnormalities on inspection of external nose and ears  NECK: normal movements of the head and neck  LUNGS: on inspection no signs of respiratory distress, breathing rate appears normal, no obvious gross SOB, gasping or wheezing  CV: no obvious cyanosis  MS: moves all visible extremities without noticeable abnormality  PSYCH/NEURO: pleasant and cooperative, no obvious depression or anxiety, speech and thought processing grossly intact  ASSESSMENT AND PLAN:  Discussed the following assessment and plan:  Class 2 severe obesity due to excess calories with serious comorbidity and body mass index (BMI) of 35.0 to 35.9 in adult ([place]) - Plan: HgB A1c, phentermine 30 MG capsule, DISCONTINUED: phentermine 30 MG capsule  Weight loss counseling, encounter for - Plan: phentermine 30 MG capsule, DISCONTINUED: phentermine 30 MG capsule  Premature ejaculation - Plan: Testosterone, B12 and Folate Panel, Comp Met (CMET), CBC w/Diff, Vitamin D (25 hydroxy), TSH  Long discussion with patient in regards to strategies to achieve weight loss.  Advised that I will renew the phentermine tablet, but this medication is not meant for long-term and is only meant to be used as a jumpstart to help kickoff weight loss.  And this medication must be used in conjunction with healthy diet and regular physical activity.  Advised that this medication will not cause weight loss, this medication helps reduce appetite and in combination with appetite reduction and healthy eating and exercise -- weight loss is achieved.  Also the reason that you need to begin to eat healthier and exercise regularly is because to be able to sustain long-term weight loss and keep the weight off requires long-term healthy eating and long-term physical activity.  The idea is to find a eating and exercise program  that works for your life see you were able to commit to it for the rest of your life. Discussed a diet high in lean proteins, lots of vegetables and lower in carbohydrates and sugars.  Discussed calorie counting and weight watcher type programs.  Recommended regular cardio activity and weight training.  Recommended keeping up good water intake.  Patient will be coming in next week for blood work to further investigate premature ejaculation.   I discussed the assessment and treatment plan with the patient. The patient was provided an opportunity to ask questions and all were answered. The patient agreed with the plan and demonstrated an understanding of the instructions.   The patient was advised to call back or seek an in-person evaluation if the symptoms worsen or if the condition fails to improve as anticipated.  I provided 25 minutes of video face-to-face time during this encounter.  He will follow up in 4 weeks for recheck on weight loss.  Jodelle Green, FNP

## 2019-04-07 NOTE — Telephone Encounter (Signed)
Done and I called the patient and informed him of both appts.  Aldine Grainger,cma

## 2019-04-14 ENCOUNTER — Other Ambulatory Visit: Payer: Self-pay

## 2019-04-14 ENCOUNTER — Other Ambulatory Visit (INDEPENDENT_AMBULATORY_CARE_PROVIDER_SITE_OTHER): Payer: BLUE CROSS/BLUE SHIELD

## 2019-04-14 DIAGNOSIS — Z6835 Body mass index (BMI) 35.0-35.9, adult: Secondary | ICD-10-CM | POA: Diagnosis not present

## 2019-04-14 DIAGNOSIS — F524 Premature ejaculation: Secondary | ICD-10-CM | POA: Diagnosis not present

## 2019-04-14 LAB — CBC WITH DIFFERENTIAL/PLATELET
Basophils Absolute: 0 10*3/uL (ref 0.0–0.1)
Basophils Relative: 0.4 % (ref 0.0–3.0)
Eosinophils Absolute: 0.3 10*3/uL (ref 0.0–0.7)
Eosinophils Relative: 3.7 % (ref 0.0–5.0)
HCT: 47.4 % (ref 39.0–52.0)
Hemoglobin: 16 g/dL (ref 13.0–17.0)
Lymphocytes Relative: 47.1 % — ABNORMAL HIGH (ref 12.0–46.0)
Lymphs Abs: 3.9 10*3/uL (ref 0.7–4.0)
MCHC: 33.7 g/dL (ref 30.0–36.0)
MCV: 84.6 fl (ref 78.0–100.0)
Monocytes Absolute: 0.6 10*3/uL (ref 0.1–1.0)
Monocytes Relative: 6.7 % (ref 3.0–12.0)
Neutro Abs: 3.5 10*3/uL (ref 1.4–7.7)
Neutrophils Relative %: 42.1 % — ABNORMAL LOW (ref 43.0–77.0)
Platelets: 268 10*3/uL (ref 150.0–400.0)
RBC: 5.6 Mil/uL (ref 4.22–5.81)
RDW: 13.7 % (ref 11.5–15.5)
WBC: 8.3 10*3/uL (ref 4.0–10.5)

## 2019-04-14 LAB — COMPREHENSIVE METABOLIC PANEL
ALT: 46 U/L (ref 0–53)
AST: 24 U/L (ref 0–37)
Albumin: 4.9 g/dL (ref 3.5–5.2)
Alkaline Phosphatase: 76 U/L (ref 39–117)
BUN: 11 mg/dL (ref 6–23)
CO2: 29 mEq/L (ref 19–32)
Calcium: 10.3 mg/dL (ref 8.4–10.5)
Chloride: 100 mEq/L (ref 96–112)
Creatinine, Ser: 1.16 mg/dL (ref 0.40–1.50)
GFR: 88.63 mL/min (ref >60.00)
Glucose, Bld: 105 mg/dL — ABNORMAL HIGH (ref 70–99)
Potassium: 4.7 mEq/L (ref 3.5–5.1)
Sodium: 138 mEq/L (ref 135–145)
Total Bilirubin: 0.8 mg/dL (ref 0.2–1.2)
Total Protein: 7.8 g/dL (ref 6.0–8.3)

## 2019-04-14 LAB — HEMOGLOBIN A1C: Hgb A1c MFr Bld: 6.5 % (ref 4.6–6.5)

## 2019-04-14 LAB — TSH: TSH: 1.28 u[IU]/mL (ref 0.35–4.50)

## 2019-04-14 LAB — VITAMIN D 25 HYDROXY (VIT D DEFICIENCY, FRACTURES): VITD: 36.71 ng/mL (ref 30.00–100.00)

## 2019-04-14 LAB — TESTOSTERONE: Testosterone: 245.62 ng/dL — ABNORMAL LOW (ref 300.00–890.00)

## 2019-04-14 LAB — B12 AND FOLATE PANEL
Folate: 22.9 ng/mL (ref >5.9)
Vitamin B-12: >1515 pg/mL — ABNORMAL HIGH (ref 211–911)

## 2019-04-16 NOTE — Addendum Note (Signed)
Addended by: Leanora Cover on: 04/16/2019 08:48 PM   Modules accepted: Orders

## 2019-04-17 ENCOUNTER — Other Ambulatory Visit: Payer: BLUE CROSS/BLUE SHIELD

## 2019-05-01 ENCOUNTER — Other Ambulatory Visit: Payer: Self-pay

## 2019-05-01 ENCOUNTER — Other Ambulatory Visit (INDEPENDENT_AMBULATORY_CARE_PROVIDER_SITE_OTHER): Payer: BLUE CROSS/BLUE SHIELD

## 2019-05-01 DIAGNOSIS — F524 Premature ejaculation: Secondary | ICD-10-CM

## 2019-05-01 LAB — TESTOSTERONE: Testosterone: 334.05 ng/dL (ref 300.00–890.00)

## 2019-05-06 DIAGNOSIS — L638 Other alopecia areata: Secondary | ICD-10-CM | POA: Diagnosis not present

## 2019-05-08 ENCOUNTER — Telehealth: Payer: Self-pay | Admitting: *Deleted

## 2019-05-08 ENCOUNTER — Ambulatory Visit (INDEPENDENT_AMBULATORY_CARE_PROVIDER_SITE_OTHER): Payer: BC Managed Care – PPO | Admitting: Family Medicine

## 2019-05-08 ENCOUNTER — Other Ambulatory Visit: Payer: Self-pay

## 2019-05-08 VITALS — Wt 213.0 lb

## 2019-05-08 DIAGNOSIS — F418 Other specified anxiety disorders: Secondary | ICD-10-CM

## 2019-05-08 DIAGNOSIS — F524 Premature ejaculation: Secondary | ICD-10-CM | POA: Diagnosis not present

## 2019-05-08 DIAGNOSIS — Z713 Dietary counseling and surveillance: Secondary | ICD-10-CM

## 2019-05-08 DIAGNOSIS — Z6835 Body mass index (BMI) 35.0-35.9, adult: Secondary | ICD-10-CM

## 2019-05-08 MED ORDER — ESCITALOPRAM OXALATE 5 MG PO TABS: 5.0000 mg | ORAL_TABLET | Freq: Every day | ORAL | 2 refills | Status: DC

## 2019-05-08 MED ORDER — PHENTERMINE HCL 30 MG PO CAPS: 30.0000 mg | ORAL_CAPSULE | ORAL | 0 refills | Status: DC

## 2019-05-08 NOTE — Telephone Encounter (Signed)
Copied from CRM [id number]. Topic: General - Other >> May 08, 2019 11:21 AM Tamela Oddi wrote: Reason for CRM: Patient called to inform the office that his prescriptions were sent to the wrong pharmacy.  Patient does not want any scripts [facility] to [facility] that [facility].  Agent has removed it and patient would like future medications sent to [facility][id number] [place], [place] - [address] AT [facility] OF [address] [place] [phone] (Phone) [phone] (Fax).  Patient would like the prescriptions to be recent.  Please advise and call patient if there are any questions.  CB# [phone]

## 2019-05-08 NOTE — Telephone Encounter (Signed)
Called Pt to tell him [facility] pharmacy [facility] to [facility], Pt stated that he understood

## 2019-05-08 NOTE — Progress Notes (Signed)
Patient ID: Brandon Marsh, male   DOB: [birth year], 33 y.o.   MRN: [id number]    Virtual Visit via video Note  This visit type was conducted due to national recommendations for restrictions regarding the COVID-19 pandemic (e.g. social distancing).  This format is felt to be most appropriate for this patient at this time.  All issues noted in this document were discussed and addressed.  No physical exam was performed (except for noted visual exam findings with Video Visits).   I connected with Brandon Marsh today at 10:00 AM EDT by a video enabled telemedicine application and verified that I am speaking with the correct person using two identifiers. Location patient: home Location provider: [facility] Persons participating in the virtual visit: patient, provider  I discussed the limitations, risks, security and privacy concerns of performing an evaluation and management service by video and the availability of in person appointments. I also discussed with the patient that there may be a patient responsible charge related to this service. The patient expressed understanding and agreed to proceed.  HPI:  Patient and I connected via video to follow-up on weight loss and also to discuss premature ejaculation.  In regards to weight loss, patient is doing well.  Tolerating phentermine without any adverse effects, no chest pain, shortness of breath or palpitations.  He is down almost 10 pounds since last visit and he is very pleased with his progress.  In addition to the medication he is watching his calories, eating small meals throughout the day and trying to get more physical activity and.  Patient still has times with premature ejaculation.  Testosterone was checked and first check was slightly low, second check was in the normal range.  Patient states he has been feeling a little down lately and wonders if this is contributing.  Denies any SI or HI.  States life has been stressful recently  due to the birth of a new child, illness of family member and also the additional stressors brought on by the COVID-19 pandemic in regards to finances and not being able to go out and do things as much.   ROS: See pertinent positives and negatives per HPI.  Past Medical History:  Diagnosis Date  . Alopecia   . Asthma   . Elevated blood pressure reading without diagnosis of hypertension     Past Surgical History:  Procedure Laterality Date  . WISDOM TOOTH EXTRACTION      Family History  Problem Relation Age of Onset  . Asthma Father   . Hypertension Mother   . Diabetes Maternal Grandmother   . Hypertension Maternal Grandmother   . Alcohol abuse Paternal Grandmother    Social History   Tobacco Use  . Smoking status: Never Smoker  . Smokeless tobacco: Never Used  Substance Use Topics  . Alcohol use: Yes    Alcohol/week: 0.0 standard drinks    Current Outpatient Medications:  .  albuterol (PROVENTIL HFA;VENTOLIN HFA) 108 (90 Base) MCG/ACT inhaler, INHALE ONE PUFF EVERY 6 HOURS AS NEEDED WHEEZING, Disp: 8.5 Inhaler, Rfl: 0 .  albuterol (PROVENTIL HFA;VENTOLIN HFA) 108 (90 Base) MCG/ACT inhaler, INHALE ONE PUFF EVERY 6 HOURS AS NEEDED WHEEZING, Disp: 8.5 Inhaler, Rfl: 0 .  budesonide-formoterol (SYMBICORT) 160-4.5 MCG/ACT inhaler, Inhale 2 puffs into the lungs 2 (two) times daily., Disp: 1 Inhaler, Rfl: 12 .  Ergocalciferol 50 MCG (2000 UT) TABS, Take 1 tablet by mouth daily., Disp: 90 tablet, Rfl: 1 .  phentermine 30 MG capsule, Take 1  capsule (30 mg total) by mouth every morning., Disp: 30 capsule, Rfl: 0  EXAM:    Wt Readings from Last 3 Encounters:  05/08/19 213 lb (96.6 kg)  04/07/19 223 lb (101.2 kg)  10/13/18 216 lb (98 kg)    GENERAL: alert, oriented, appears well and in no acute distress  HEENT: atraumatic, conjunttiva clear, no obvious abnormalities on inspection of external nose and ears  NECK: normal movements of the head and neck  LUNGS: on inspection  no signs of respiratory distress, breathing rate appears normal, no obvious gross SOB, gasping or wheezing  CV: no obvious cyanosis  MS: moves all visible extremities without noticeable abnormality  PSYCH/NEURO: pleasant and cooperative, no obvious depression or anxiety, speech and thought processing grossly intact  ASSESSMENT AND PLAN:  Discussed the following assessment and plan:  Class 2 severe obesity due to excess calories with serious comorbidity and body mass index (BMI) of 35.0 to 35.9 in adult (HCC) - Plan: phentermine 30 MG capsule  Weight loss counseling, encounter for - Plan: phentermine 30 MG capsule  Premature ejaculation - Plan: escitalopram (LEXAPRO) 5 MG tablet  Depression with anxiety - Plan: escitalopram (LEXAPRO) 5 MG tablet  Patient is doing great with his weight loss we will continue phentermine daily and also healthy diet and regular exercise routine.  It is very possible that depression and anxiety is contributing to premature ejaculation.  Patient is agreeable to start a very low-dose Lexapro to see if this will help his mood improved.  He does have good support from his family and feels he shows going through a rough patch due to a lot of changes in his life and additional stressors.  Offered referral to counseling however he declines.   I discussed the assessment and treatment plan with the patient. The patient was provided an opportunity to ask questions and all were answered. The patient agreed with the plan and demonstrated an understanding of the instructions.   The patient was advised to call back or seek an in-person evaluation if the symptoms worsen or if the condition fails to improve as anticipated.   A total of 25 minutes were spent face-to-face with the patient during this encounter and over half of that time was spent on counseling and coordination of care. The patient was counseled on weight loss , medications, lab results, mood.    Patient  will otherwise follow-up in 4 weeks for recheck and how he is doing with his weight loss and also with mood after addition of Lexapro.  Brandon HarriesLauren M Sekou Zuckerman, FNP

## 2019-05-12 ENCOUNTER — Encounter: Payer: Self-pay | Admitting: Family Medicine

## 2019-06-11 DIAGNOSIS — L638 Other alopecia areata: Secondary | ICD-10-CM | POA: Diagnosis not present

## 2019-08-16 ENCOUNTER — Other Ambulatory Visit: Payer: Self-pay | Admitting: Family Medicine

## 2019-08-16 DIAGNOSIS — F418 Other specified anxiety disorders: Secondary | ICD-10-CM

## 2019-08-16 DIAGNOSIS — F524 Premature ejaculation: Secondary | ICD-10-CM

## 2019-12-09 DIAGNOSIS — L638 Other alopecia areata: Secondary | ICD-10-CM | POA: Diagnosis not present

## 2020-02-05 DIAGNOSIS — L638 Other alopecia areata: Secondary | ICD-10-CM | POA: Diagnosis not present

## 2020-04-19 DIAGNOSIS — M222X2 Patellofemoral disorders, left knee: Secondary | ICD-10-CM | POA: Diagnosis not present

## 2020-04-19 DIAGNOSIS — M222X1 Patellofemoral disorders, right knee: Secondary | ICD-10-CM | POA: Diagnosis not present

## 2020-04-21 ENCOUNTER — Ambulatory Visit (INDEPENDENT_AMBULATORY_CARE_PROVIDER_SITE_OTHER): Payer: BC Managed Care – PPO

## 2020-04-21 ENCOUNTER — Encounter: Payer: Self-pay | Admitting: Nurse Practitioner

## 2020-04-21 ENCOUNTER — Ambulatory Visit (INDEPENDENT_AMBULATORY_CARE_PROVIDER_SITE_OTHER): Payer: BC Managed Care – PPO | Admitting: Nurse Practitioner

## 2020-04-21 ENCOUNTER — Other Ambulatory Visit: Payer: Self-pay

## 2020-04-21 ENCOUNTER — Ambulatory Visit: Payer: Self-pay | Admitting: Nurse Practitioner

## 2020-04-21 VITALS — BP 122/82 | HR 94 | Temp 97.4°F | Ht 65.0 in | Wt 229.0 lb

## 2020-04-21 DIAGNOSIS — M25532 Pain in left wrist: Secondary | ICD-10-CM

## 2020-04-21 DIAGNOSIS — F419 Anxiety disorder, unspecified: Secondary | ICD-10-CM | POA: Diagnosis not present

## 2020-04-21 DIAGNOSIS — F329 Major depressive disorder, single episode, unspecified: Secondary | ICD-10-CM

## 2020-04-21 DIAGNOSIS — F32A Depression, unspecified: Secondary | ICD-10-CM

## 2020-04-21 HISTORY — DX: Depression, unspecified: F32.A

## 2020-04-21 HISTORY — DX: Pain in left wrist: M25.532

## 2020-04-21 HISTORY — DX: Anxiety disorder, unspecified: F41.9

## 2020-04-21 NOTE — Progress Notes (Addendum)
Established Patient Office Visit  Subjective:  Patient ID: Brandon Marsh, male    DOB: 10/06/1986  Age: 34 y.o. MRN: [id number]  CC:  Chief Complaint  Patient presents with  . Acute Visit    left wrist pain    HPI Brandon Marsh is a 34 yo who presents for 2 concerns.   1. Wrist pain:  chronic intermittent left wrist mild pain for about 2 years. No known injury or trauma. He has been a serious weight lifter and has had to cut back. He is lifting 40 lbs now.  He noticed a visible, tender lump at the base of the ulna about one month ago. 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 He a a PT appt arranged for the knee on May 04, 2020. He has an [facility] appt coming up. He has not discussed his wrist concerns with his orthopedist, yet.   2. He also has anxiety and wants to talk to a behavioral therapist. He has felt more depression after recent life stressors with changes. His father-in law passed away and a new daughter was born and his mother-in law moved into their home 12/2018. He used to take Lexapro 5 mg but wants to stay off medication. He experienced weight gain. Current: PHQ-9: 17, GAD-7: 12   Obesity: BMI 38.11. He attributes past weight gain to prednisone.  Wt Readings from Last 3 Encounters:  04/21/20 229 lb (103.9 kg)  05/08/19 213 lb (96.6 kg)  04/07/19 223 lb (101.2 kg)    Past Medical History:  Diagnosis Date  . Alopecia   . Anxiety and depression 04/21/2020  . Asthma   . Elevated blood pressure reading without diagnosis of hypertension   . Wrist pain, left 04/21/2020    Past Surgical History:  Procedure Laterality Date  . WISDOM TOOTH EXTRACTION      Family History  Problem Relation Age of Onset  . Asthma Father   . Hypertension Mother   . Diabetes Maternal Grandmother   . Hypertension Maternal Grandmother   . Alcohol abuse Paternal Grandmother     Social History   Socioeconomic History  .  Marital status: Married    Spouse name: Not on file  . Number of children: Not on file  . Years of education: Not on file  . Highest education level: Not on file  Occupational History  . Occupation: Systems developer  Tobacco Use  . Smoking status: Never Smoker  . Smokeless tobacco: Never Used  Substance and Sexual Activity  . Alcohol use: Yes    Alcohol/week: 0.0 standard drinks    Comment: rarely  . Drug use: No  . Sexual activity: Yes  Other Topics Concern  . Not on file  Social History Narrative   Married, has 33 year old and 8 year old and mother in law with health issues   Social Determinants of Health   Financial Resource Strain:   . Difficulty of Paying Living Expenses:   Food Insecurity:   . Worried About Programme researcher, broadcasting/film/video in the Last Year:   . Barista in the Last Year:   Transportation Needs:   . Freight forwarder (Medical):   Marland Kitchen Lack of Transportation (Non-Medical):   Physical Activity:   . Days of Exercise per Week:   . Minutes of Exercise per Session:   Stress:   . Feeling of Stress :   Social  Connections:   . Frequency of Communication with Friends and Family:   . Frequency of Social Gatherings with Friends and Family:   . Attends Religious Services:   . Active Member of Clubs or Organizations:   . Attends Banker Meetings:   Marland Kitchen Marital Status:   Intimate Partner Violence:   . Fear of Current or Ex-Partner:   . Emotionally Abused:   Marland Kitchen Physically Abused:   . Sexually Abused:     Outpatient Medications Prior to Visit  Medication Sig Dispense Refill  . albuterol (PROVENTIL HFA;VENTOLIN HFA) 108 (90 Base) MCG/ACT inhaler INHALE ONE PUFF EVERY 6 HOURS AS NEEDED WHEEZING 8.5 Inhaler 0  . budesonide-formoterol (SYMBICORT) 160-4.5 MCG/ACT inhaler Inhale 2 puffs into the lungs 2 (two) times daily. 1 Inhaler 12  . Ergocalciferol 50 MCG (2000 UT) TABS Take 1 tablet by mouth daily. 90 tablet 1  . escitalopram (LEXAPRO) 5 MG tablet  TAKE 1 TABLET BY MOUTH EVERYDAY AT BEDTIME 30 tablet 2  . phentermine 30 MG capsule Take 1 capsule (30 mg total) by mouth every morning. 30 capsule 0  . albuterol (PROVENTIL HFA;VENTOLIN HFA) 108 (90 Base) MCG/ACT inhaler INHALE ONE PUFF EVERY 6 HOURS AS NEEDED WHEEZING 8.5 Inhaler 0   No facility-administered medications prior to visit.    No Known Allergies  Review of Systems  Constitutional: Negative for chills and fever.  HENT: Negative for congestion and sinus pain.   Respiratory: Negative for cough and shortness of breath.   Cardiovascular: Negative for chest pain and leg swelling.  Gastrointestinal: Negative.   Genitourinary: Negative.   Musculoskeletal: Positive for arthralgias.  Skin: Negative.   Neurological: Negative.   Hematological: Negative.   Psychiatric/Behavioral:       See HPI. No SI/HI.       Objective:    Physical Exam  Constitutional: He is oriented to person, place, and time. He appears well-developed and well-nourished.  HENT:  Head: Normocephalic and atraumatic.  Eyes: Pupils are equal, round, and reactive to light.  Cardiovascular: Normal rate, regular rhythm and normal heart sounds.  Pulmonary/Chest: Effort normal and breath sounds normal.  Musculoskeletal:        General: Normal range of motion.     Cervical back: Normal range of motion.  Neurological: He is alert and oriented to person, place, and time.  Skin: Skin is warm and dry.  Psychiatric: He has a normal mood and affect. His behavior is normal. Judgment and thought content normal.  Vitals reviewed.  BP 122/82 (BP Location: Left Arm, Patient Position: Sitting, Cuff Size: Large)   Pulse 94   Temp (!) 97.4 F (36.3 C) (Skin)   Ht 5\' 5"  (1.651 m)   Wt 229 lb (103.9 kg)   SpO2 97%   BMI 38.11 kg/m  Wt Readings from Last 3 Encounters:  04/21/20 229 lb (103.9 kg)  05/08/19 213 lb (96.6 kg)  04/07/19 223 lb (101.2 kg)    Health Maintenance Due  Topic Date Due  . HIV Screening   Never done    There are no preventive care reminders to display for this patient.  Lab Results  Component Value Date   TSH 1.28 04/14/2019   Lab Results  Component Value Date   WBC 8.3 04/14/2019   HGB 16.0 04/14/2019   HCT 47.4 04/14/2019   MCV 84.6 04/14/2019   PLT 268.0 04/14/2019   Lab Results  Component Value Date   NA 138 04/14/2019   K 4.7 04/14/2019  CO2 29 04/14/2019   GLUCOSE 105 (H) 04/14/2019   BUN 11 04/14/2019   CREATININE 1.16 04/14/2019   BILITOT 0.8 04/14/2019   ALKPHOS 76 04/14/2019   AST 24 04/14/2019   ALT 46 04/14/2019   PROT 7.8 04/14/2019   ALBUMIN 4.9 04/14/2019   CALCIUM 10.3 04/14/2019   GFR 88.63 04/14/2019   Lab Results  Component Value Date   CHOL 225 (H) 10/13/2018   Lab Results  Component Value Date   HDL 37.90 (L) 10/13/2018   Lab Results  Component Value Date   LDLCALC 101 (H) 09/27/2015   Lab Results  Component Value Date   TRIG (H) 10/13/2018    461.0 Triglyceride is over 400; calculations on Lipids are invalid.   Lab Results  Component Value Date   CHOLHDL 6 10/13/2018   Lab Results  Component Value Date   HGBA1C 6.5 04/14/2019   CLINICAL DATA:  Left wrist pain  EXAM: LEFT WRIST - COMPLETE 3+ VIEW  COMPARISON:  None.  FINDINGS: Minimal cortex irregularity at the mid to distal scaphoid bone without associated lucency. No dislocation. Soft tissue swelling is present.  IMPRESSION: Minimal cortex irregularity at the mid to distal scaphoid, though without definitive lucency through the adjacent bone. Correlate for point tenderness to the region. Otherwise negative wrist radiograph   Electronically Signed   By: Donavan Foil M.D.   On: 04/21/2020 22:20   Assessment & Plan:   Problem List Items Addressed This Visit      Other   Anxiety and depression   Relevant Orders   Ambulatory referral to Psychology   Wrist pain, left    Other Visit Diagnoses    Left wrist pain    -  Primary   Relevant  Orders   DG Wrist Complete Left (Completed)      No orders of the defined types were placed in this encounter.  Please contact orthopedics regarding the left wrist pain and suspected ganglion cyst.  Await radiology report, wrist x-ray today.  I placed referral in for behavioral health. We discussed management of anxiety.    Follow-up with me in 3 months preventative care visit.  This visit occurred during the SARS-CoV-2 public health emergency.  Safety protocols were in place, including screening questions prior to the visit, additional usage of staff PPE, and extensive cleaning of exam room while observing appropriate contact time as indicated for disinfecting solutions.   Addendum: He has a soft tissue swelling on the ulnar side of the wrist. This Xray showed no abnormality there. On the thumb side - where the indention is at the back of the hand-side of the thumb- called anatomic snuff box - there is an irregularity. But he had no tenderness there.  PLAN: Follow up with your Orthopedic doctor as we discussed.   Follow-up: Return in about 3 months (around 07/22/2020).   This visit occurred during the SARS-CoV-2 public health emergency.  Safety protocols were in place, including screening questions prior to the visit, additional usage of staff PPE, and extensive cleaning of exam room while observing appropriate contact time as indicated for disinfecting solutions.   Denice Paradise, NP

## 2020-04-21 NOTE — Patient Instructions (Addendum)
It was nice to see you today.   Please contact orthopedics regarding the left wrist pain and suspected ganglion cyst.  Await radiology report, wrist x-ray today.  I placed referral in for behavioral health.  Follow-up with me in 3 months preventative care visit.   Managing Anxiety, Adult After being diagnosed with an anxiety disorder, you may be relieved to know why you have felt or behaved a certain way. You may also feel overwhelmed about the treatment ahead and what it will mean for your life. With care and support, you can manage this condition and recover from it. How to manage lifestyle changes Managing stress and anxiety  Stress is your body's reaction to life changes and events, both good and bad. Most stress will last just a few hours, but stress can be ongoing and can lead to more than just stress. Although stress can play a major role in anxiety, it is not the same as anxiety. Stress is usually caused by something external, such as a deadline, test, or competition. Stress normally passes after the triggering event has ended.  Anxiety is caused by something internal, such as imagining a terrible outcome or worrying that something will go wrong that will devastate you. Anxiety often does not go away even after the triggering event is over, and it can become long-term (chronic) worry. It is important to understand the differences between stress and anxiety and to manage your stress effectively so that it does not lead to an anxious response. Talk with your health care provider or a counselor to learn more about reducing anxiety and stress. He or she may suggest tension reduction techniques, such as:  Music therapy. This can include creating or listening to music that you enjoy and that inspires you.  Mindfulness-based meditation. This involves being aware of your normal breaths while not trying to control your breathing. It can be done while sitting or walking.  Centering prayer. This  involves focusing on a word, phrase, or sacred image that means something to you and brings you peace.  Deep breathing. To do this, expand your stomach and inhale slowly through your nose. Hold your breath for 3-5 seconds. Then exhale slowly, letting your stomach muscles relax.  Self-talk. This involves identifying thought patterns that lead to anxiety reactions and changing those patterns.  Muscle relaxation. This involves tensing muscles and then relaxing them. Choose a tension reduction technique that suits your lifestyle and personality. These techniques take time and practice. Set aside 5-15 minutes a day to do them. Therapists can offer counseling and training in these techniques. The training to help with anxiety may be covered by some insurance plans. Other things you can do to manage stress and anxiety include:  Keeping a stress/anxiety diary. This can help you learn what triggers your reaction and then learn ways to manage your response.  Thinking about how you react to certain situations. You may not be able to control everything, but you can control your response.  Making time for activities that help you relax and not feeling guilty about spending your time in this way.  Visual imagery and yoga can help you stay calm and relax.  Medicines Medicines can help ease symptoms. Medicines for anxiety include:  Anti-anxiety drugs.  Antidepressants. Medicines are often used as a primary treatment for anxiety disorder. Medicines will be prescribed by a health care provider. When used together, medicines, psychotherapy, and tension reduction techniques may be the most effective treatment. Relationships Relationships can play a  big part in helping you recover. Try to spend more time connecting with trusted friends and family members. Consider going to couples counseling, taking family education classes, or going to family therapy. Therapy can help you and others better understand your  condition. How to recognize changes in your anxiety Everyone responds differently to treatment for anxiety. Recovery from anxiety happens when symptoms decrease and stop interfering with your daily activities at home or work. This may mean that you will start to:  Have better concentration and focus. Worry will interfere less in your daily thinking.  Sleep better.  Be less irritable.  Have more energy.  Have improved memory. It is important to recognize when your condition is getting worse. Contact your health care provider if your symptoms interfere with home or work and you feel like your condition is not improving. Follow these instructions at home: Activity  Exercise. Most adults should do the following: ? Exercise for at least 150 minutes each week. The exercise should increase your heart rate and make you sweat (moderate-intensity exercise). ? Strengthening exercises at least twice a week.  Get the right amount and quality of sleep. Most adults need 7-9 hours of sleep each night. Lifestyle   Eat a healthy diet that includes plenty of vegetables, fruits, whole grains, low-fat dairy products, and lean protein. Do not eat a lot of foods that are high in solid fats, added sugars, or salt.  Make choices that simplify your life.  Do not use any products that contain nicotine or tobacco, such as cigarettes, e-cigarettes, and chewing tobacco. If you need help quitting, ask your health care provider.  Avoid caffeine, alcohol, and certain over-the-counter cold medicines. These may make you feel worse. Ask your pharmacist which medicines to avoid. General instructions  Take over-the-counter and prescription medicines only as told by your health care provider.  Keep all follow-up visits as told by your health care provider. This is important. Where to find support You can get help and support from these sources:  Self-help groups.  Online and [employer] organizations.  A trusted  spiritual leader.  Couples counseling.  Family education classes.  Family therapy. Where to find more information You may find that joining a support group helps you deal with your anxiety. The following sources can help you locate counselors or support groups near you:  Mental Health America: www.mentalhealthamerica.net  Anxiety and Depression Association of [place] (ADAA): ProgramCam.dewww.adaa.org  [employer] on Mental Illness (NAMI): www.nami.org Contact a health care provider if you:  Have a hard time staying focused or finishing daily tasks.  Spend many hours a day feeling worried about everyday life.  Become exhausted by worry.  Start to have headaches, feel tense, or have nausea.  Urinate more than normal.  Have diarrhea. Get help right away if you have:  A racing heart and shortness of breath.  Thoughts of hurting yourself or others. If you ever feel like you may hurt yourself or others, or have thoughts about taking your own life, get help right away. You can go to your nearest emergency department or call:  Your local emergency services (911 in the [place]  A suicide crisis helpline, such as the National Suicide Prevention Lifeline at (804)082-24301-813-098-3940. This is open 24 hours a day. Summary  Taking steps to learn and use tension reduction techniques can help calm you and help prevent triggering an anxiety reaction.  When used together, medicines, psychotherapy, and tension reduction techniques may be the most effective treatment.  Family, friends, and partners can play a big part in helping you recover from an anxiety disorder. This information is not intended to replace advice given to you by your health care provider. Make sure you discuss any questions you have with your health care provider. Document Revised: 04/21/2019 Document Reviewed: 04/21/2019 Elsevier Patient Education  2020 Elsevier Inc.  Major Depressive Disorder, Adult Major depressive disorder (MDD)  is a mental health condition. It may also be called clinical depression or unipolar depression. MDD usually causes feelings of sadness, hopelessness, or helplessness. MDD can also cause physical symptoms. It can interfere with work, school, relationships, and other everyday activities. MDD may be mild, moderate, or severe. It may occur once (single episode major depressive disorder) or it may occur multiple times (recurrent major depressive disorder). What are the causes? The exact cause of this condition is not known. MDD is most likely caused by a combination of things, which may include:  Genetic factors. These are traits that are passed along from parent to child.  Individual factors. Your personality, your behavior, and the way you handle your thoughts and feelings may contribute to MDD. This includes personality traits and behaviors learned from others.  Physical factors, such as: ? Differences in the part of your brain that controls emotion. This part of your brain may be different than it is in people who do not have MDD. ? Long-term (chronic) medical or psychiatric illnesses.  Social factors. Traumatic experiences or major life changes may play a role in the development of MDD. What increases the risk? This condition is more likely to develop in women. The following factors may also make you more likely to develop MDD:  A family history of depression.  Troubled family relationships.  Abnormally low levels of certain brain chemicals.  Traumatic events in childhood, especially abuse or the loss of a parent.  Being under a lot of stress, or long-term stress, especially from upsetting life experiences or losses.  A history of: ? Chronic physical illness. ? Other mental health disorders. ? Substance abuse.  Poor living conditions.  Experiencing social exclusion or discrimination on a regular basis. What are the signs or symptoms? The main symptoms of MDD typically  include:  Constant depressed or irritable mood.  Loss of interest in things and activities. MDD symptoms may also include:  Sleeping or eating too much or too little.  Unexplained weight change.  Fatigue or low energy.  Feelings of worthlessness or guilt.  Difficulty thinking clearly or making decisions.  Thoughts of suicide or of harming others.  Physical agitation or weakness.  Isolation. Severe cases of MDD may also occur with other symptoms, such as:  Delusions or hallucinations, in which you imagine things that are not real (psychotic depression).  Low-level depression that lasts at least a year (chronic depression or persistent depressive disorder).  Extreme sadness and hopelessness (melancholic depression).  Trouble speaking and moving (catatonic depression). How is this diagnosed? This condition may be diagnosed based on:  Your symptoms.  Your medical history, including your mental health history. This may involve tests to evaluate your mental health. You may be asked questions about your lifestyle, including any drug and alcohol use, and how long you have had symptoms of MDD.  A physical exam.  Blood tests to rule out other conditions. You must have a depressed mood and at least four other MDD symptoms most of the day, nearly every day in the same 2-week timeframe before your health care provider can  confirm a diagnosis of MDD. How is this treated? This condition is usually treated by mental health professionals, such as psychologists, psychiatrists, and clinical social workers. You may need more than one type of treatment. Treatment may include:  Psychotherapy. This is also called talk therapy or counseling. Types of psychotherapy include: ? Cognitive behavioral therapy (CBT). This type of therapy teaches you to recognize unhealthy feelings, thoughts, and behaviors, and replace them with positive thoughts and actions. ? Interpersonal therapy (IPT). This helps  you to improve the way you relate to and communicate with others. ? Family therapy. This treatment includes members of your family.  Medicine to treat anxiety and depression, or to help you control certain emotions and behaviors.  Lifestyle changes, such as: ? Limiting alcohol and drug use. ? Exercising regularly. ? Getting plenty of sleep. ? Making healthy eating choices. ? Spending more time outdoors.  Treatments involving stimulation of the brain can be used in situations with extremely severe symptoms, or when medicine or other therapies do not work over time. These treatments include electroconvulsive therapy, transcranial magnetic stimulation, and vagal nerve stimulation. Follow these instructions at home: Activity  Return to your normal activities as told by your health care provider.  Exercise regularly and spend time outdoors as told by your health care provider. General instructions  Take over-the-counter and prescription medicines only as told by your health care provider.  Do not drink alcohol. If you drink alcohol, limit your alcohol intake to no more than 1 drink a day for nonpregnant women and 2 drinks a day for men. One drink equals 12 oz of beer, 5 oz of wine, or 1 oz of hard liquor. Alcohol can affect any antidepressant medicines you are taking. Talk to your health care provider about your alcohol use.  Eat a healthy diet and get plenty of sleep.  Find activities that you enjoy doing, and make time to do them.  Consider joining a support group. Your health care provider may be able to recommend a support group.  Keep all follow-up visits as told by your health care provider. This is important. Where to find more information [employer] on Mental Illness  www.nami.org [place] National Institute of Mental Health  https://carter.com/ National Suicide Prevention Lifeline  1-800-273-TALK 515-452-8790). This is free, 24-hour help. Contact a health care provider  if:  Your symptoms get worse.  You develop new symptoms. Get help right away if:  You self-harm.  You have serious thoughts about hurting yourself or others.  You see, hear, taste, smell, or feel things that are not present (hallucinate). This information is not intended to replace advice given to you by your health care provider. Make sure you discuss any questions you have with your health care provider. Document Revised: 11/01/2017 Document Reviewed: 05/30/2016 Elsevier Patient Education  [place].  Wrist Pain, Adult There are many things that can cause wrist pain. Some common causes include:  An injury to the wrist area, such as a sprain, strain, or fracture.  Overuse of the joint.  A condition that causes increased pressure on a nerve in the wrist (carpal tunnel syndrome).  Wear and tear of the joints that occurs with aging (osteoarthritis).  A variety of other types of arthritis. Sometimes, the cause of wrist pain is not known. Often, the pain goes away when you follow instructions from your health care provider for relieving pain at home, such as resting or icing the wrist. If your wrist pain continues, it is  important to tell your health care provider. Follow these instructions at home:  Rest the wrist area for at least 48 hours or as long as told by your health care provider.  If a splint or elastic bandage has been applied, use it as told by your health care provider. ? Remove the splint or bandage only as told by your health care provider. ? Loosen the splint or bandage if your fingers tingle, become numb, or turn cold or blue.  If directed, apply ice to the injured area. ? If you have a removable splint or elastic bandage, remove it as told by your health care provider. ? Put ice in a plastic bag. ? Place a towel between your skin and the bag or between your splint or bandage and the bag. ? Leave the ice on for 20 minutes, 2-3 times a day.   Keep your  arm raised (elevated) above the level of your heart while you are sitting or lying down.  Take over-the-counter and prescription medicines only as told by your health care provider.  Keep all follow-up visits as told by your health care provider. This is important. Contact a health care provider if:  You have a sudden sharp pain in the wrist, hand, or arm that is different or new.  The swelling or bruising on your wrist or hand gets worse.  Your skin becomes red, gets a rash, or has open sores.  Your pain does not get better or it gets worse. Get help right away if:  You lose feeling in your fingers or hand.  Your fingers turn white, very red, or cold and blue.  You cannot move your fingers.  You have a fever or chills. This information is not intended to replace advice given to you by your health care provider. Make sure you discuss any questions you have with your health care provider. Document Revised: 11/01/2017 Document Reviewed: 06/07/2016 Elsevier Patient Education  2020 [employer].

## 2020-04-26 ENCOUNTER — Telehealth: Payer: Self-pay | Admitting: Family Medicine

## 2020-04-26 NOTE — Telephone Encounter (Signed)
Can you change referral to different location for patient?

## 2020-04-26 NOTE — Telephone Encounter (Signed)
The telephone number for the preferred place is [phone]

## 2020-04-26 NOTE — Telephone Encounter (Signed)
Pt states that we sent a referral to [facility] and he wants it sent to [facility] psychiatry  instead? Please advise

## 2020-04-29 NOTE — Telephone Encounter (Signed)
Yes referral was sent to [facility] at [facility].  Thank you!

## 2020-05-04 DIAGNOSIS — M25561 Pain in right knee: Secondary | ICD-10-CM | POA: Diagnosis not present

## 2020-05-05 ENCOUNTER — Other Ambulatory Visit: Payer: Self-pay

## 2020-05-05 MED ORDER — BUDESONIDE-FORMOTEROL FUMARATE 160-4.5 MCG/ACT IN AERO: 2.0000 | INHALATION_SPRAY | Freq: Two times a day (BID) | RESPIRATORY_TRACT | 12 refills | Status: DC

## 2020-05-05 NOTE — Progress Notes (Signed)
symbicort refill sent to pharmacy

## 2020-05-11 NOTE — Addendum Note (Signed)
Addended by: Amedeo Kinsman A on: 05/11/2020 06:08 AM   Modules accepted: Level of Service

## 2020-05-19 DIAGNOSIS — M67432 Ganglion, left wrist: Secondary | ICD-10-CM | POA: Diagnosis not present

## 2020-05-25 DIAGNOSIS — M25561 Pain in right knee: Secondary | ICD-10-CM | POA: Diagnosis not present

## 2020-05-31 DIAGNOSIS — M25561 Pain in right knee: Secondary | ICD-10-CM | POA: Diagnosis not present

## 2020-07-06 ENCOUNTER — Telehealth: Payer: Self-pay | Admitting: Psychiatry

## 2020-07-25 ENCOUNTER — Ambulatory Visit: Payer: BC Managed Care – PPO | Admitting: Nurse Practitioner

## 2020-09-13 ENCOUNTER — Other Ambulatory Visit: Payer: Self-pay

## 2020-09-13 ENCOUNTER — Telehealth: Payer: BC Managed Care – PPO | Admitting: Psychiatry

## 2020-09-18 ENCOUNTER — Telehealth: Payer: Self-pay | Admitting: Nurse Practitioner

## 2020-09-18 NOTE — Telephone Encounter (Addendum)
Received a message that he cannot see Dr. Elna Breslow as his wife sees her. I have not seen him since May.   PLAN: 1. He needs a f/up OV with me.   2. He can try these sites for counseling or I can refer him to Psych in [place].    [facility] [phone]  [facility] Clines [phone] [facility] [phone] Dr. Maryruth Bun [phone]  We have not reached him 09/22/20: No My Chart set up.   Please try again and then mail a  letter.

## 2020-09-20 NOTE — Telephone Encounter (Signed)
LMTCB

## 2020-09-22 NOTE — Telephone Encounter (Signed)
Spoke with patient and he is aware he could not be seen by Dr. Elna Breslow. I tried to give him the numbers for the places below but he was busy and couldn't write them down. I advised him to call back when he could to get them. He scheduled his f/u appt for 10/03/20 at 1:00 pm.

## 2020-10-03 ENCOUNTER — Ambulatory Visit: Payer: BC Managed Care – PPO | Admitting: Nurse Practitioner

## 2020-10-04 ENCOUNTER — Other Ambulatory Visit: Payer: Self-pay

## 2020-10-06 ENCOUNTER — Ambulatory Visit: Payer: BC Managed Care – PPO | Admitting: Nurse Practitioner

## 2020-10-06 DIAGNOSIS — Z0289 Encounter for other administrative examinations: Secondary | ICD-10-CM

## 2020-10-10 ENCOUNTER — Encounter: Payer: Self-pay | Admitting: Nurse Practitioner

## 2020-10-10 ENCOUNTER — Other Ambulatory Visit: Payer: Self-pay

## 2020-10-10 ENCOUNTER — Ambulatory Visit (INDEPENDENT_AMBULATORY_CARE_PROVIDER_SITE_OTHER): Payer: BC Managed Care – PPO | Admitting: Nurse Practitioner

## 2020-10-10 VITALS — BP 150/110 | HR 105 | Temp 98.4°F | Ht 65.0 in | Wt 223.0 lb

## 2020-10-10 DIAGNOSIS — R030 Elevated blood-pressure reading, without diagnosis of hypertension: Secondary | ICD-10-CM

## 2020-10-10 NOTE — Patient Instructions (Addendum)
BP is very elevated today- anxious. This is not typical for you.   Please go home and rest and relax today. No caffeine . Avoid all ibuprofen products.   You are due for blood work- and prefer to come back next week. We can do your physical and recheck your BP and get labs at that time.    Preventing Hypertension Hypertension, commonly called high blood pressure, is when the force of blood pumping through the arteries is too strong. Arteries are blood vessels that carry blood from the heart throughout the body. Over time, hypertension can damage the arteries and decrease blood flow to important parts of the body, including the brain, heart, and kidneys. Often, hypertension does not cause symptoms until blood pressure is very high. For this reason, it is important to have your blood pressure checked on a regular basis. Hypertension can often be prevented with diet and lifestyle changes. If you already have hypertension, you can control it with diet and lifestyle changes, as well as medicine. What nutrition changes can be made? Maintain a healthy diet. This includes:  Eating less salt (sodium). Ask your health care provider how much sodium is safe for you to have. The general recommendation is to consume less than 1 tsp (2,300 mg) of sodium a day. ? Do not add salt to your food. ? Choose low-sodium options when grocery shopping and eating out.  Limiting fats in your diet. You can do this by eating low-fat or fat-free dairy products and by eating less red meat.  Eating more fruits, vegetables, and whole grains. Make a goal to eat: ? 1-2 cups of fresh fruits and vegetables each day. ? 3-4 servings of whole grains each day.  Avoiding foods and beverages that have added sugars.  Eating fish that contain healthy fats (omega-3 fatty acids), such as mackerel or salmon. If you need help putting together a healthy eating plan, try the DASH diet. This diet is high in fruits, vegetables, and whole  grains. It is low in sodium, red meat, and added sugars. DASH stands for Dietary Approaches to Stop Hypertension. What lifestyle changes can be made?   Lose weight if you are overweight. Losing just 3?5% of your body weight can help prevent or control hypertension. ? For example, if your present weight is 200 lb (91 kg), a loss of 3-5% of your weight means losing 6-10 lb (2.7-4.5 kg). ? Ask your health care provider to help you with a diet and exercise plan to safely lose weight.  Get enough exercise. Do at least 150 minutes of moderate-intensity exercise each week. ? You could do this in short exercise sessions several times a day, or you could do longer exercise sessions a few times a week. For example, you could take a brisk 10-minute walk or bike ride, 3 times a day, for 5 days a week.  Find ways to reduce stress, such as exercising, meditating, listening to music, or taking a yoga class. If you need help reducing stress, ask your health care provider.  Do not smoke. This includes e-cigarettes. Chemicals in tobacco and nicotine products raise your blood pressure each time you smoke. If you need help quitting, ask your health care provider.  Avoid alcohol. If you drink alcohol, limit alcohol intake to no more than 1 drink a day for nonpregnant women and 2 drinks a day for men. One drink equals 12 oz of beer, 5 oz of wine, or 1 oz of hard liquor. Why are these  changes important? Diet and lifestyle changes can help you prevent hypertension, and they may make you feel better overall and improve your quality of life. If you have hypertension, making these changes will help you control it and help prevent major complications, such as:  Hardening and narrowing of arteries that supply blood to: ? Your heart. This can cause a heart attack. ? Your brain. This can cause a stroke. ? Your kidneys. This can cause kidney failure.  Stress on your heart muscle, which can cause heart failure. What can I  do to lower my risk?  Work with your health care provider to make a hypertension prevention plan that works for you. Follow your plan and keep all follow-up visits as told by your health care provider.  Learn how to check your blood pressure at home. Make sure that you know your personal target blood pressure, as told by your health care provider. How is this treated? In addition to diet and lifestyle changes, your health care provider may recommend medicines to help lower your blood pressure. You may need to try a few different medicines to find what works best for you. You also may need to take more than one medicine. Take over-the-counter and prescription medicines only as told by your health care provider. Where to find support Your health care provider can help you prevent hypertension and help you keep your blood pressure at a healthy level. Your local hospital or your community may also provide support services and prevention programs. The American Heart Association offers an online support network at: https://www.lee.net/ Where to find more information Learn more about hypertension from:  National Heart, Lung, and Blood Institute: https://www.peterson.org/  Centers for Disease Control and Prevention: AboutHD.co.nz  American Academy of Family Physicians: http://familydoctor.org/familydoctor/en/diseases-conditions/high-blood-pressure.printerview.all.html Learn more about the DASH diet from:  National Heart, Lung, and Blood Institute: WedMap.it Contact a health care provider if:  You think you are having a reaction to medicines you have taken.  You have recurrent headaches or feel dizzy.  You have swelling in your ankles.  You have trouble with your vision. Summary  Hypertension often does not cause any symptoms until blood pressure is very high. It is important to get your  blood pressure checked regularly.  Diet and lifestyle changes are the most important steps in preventing hypertension.  By keeping your blood pressure in a healthy range, you can prevent complications like heart attack, heart failure, stroke, and kidney failure.  Work with your health care provider to make a hypertension prevention plan that works for you. This information is not intended to replace advice given to you by your health care provider. Make sure you discuss any questions you have with your health care provider. Document Revised: 03/13/2019 Document Reviewed: 07/30/2016 Elsevier Patient Education  2020 [employer].

## 2020-10-10 NOTE — Progress Notes (Signed)
Established Patient Office Visit  Subjective:  Patient ID: Brandon Marsh, male    DOB: [birth year]  Age: 34 y.o. MRN: [id number]  CC:  Chief Complaint  Patient presents with  . Follow-up    HPI Brandon Marsh is a 34 yo who presents for his 7-month follow-up visit.  He was seen in May and had wrist pain and knee pain. He has been to orthopedics and is doing well.  We had talked about anxiety and he was going through a bad patch with the death of his father-in-law and some family changes.  All that settled down and he says he is doing well.  However, he just had a couple job interviews today that were very important and he presents today feeling  anxious. Heart rate and blood pressure are up.  He says this is unusual for him. PHQ-9:4 and GAD-7: 3 and these are not elevated scores.  BP Readings from Last 3 Encounters:  10/10/20 (!) 150/110  04/21/20 122/82  10/13/18 130/88    Past Medical History:  Diagnosis Date  . Alopecia   . Anxiety and depression 04/21/2020  . Asthma   . Elevated blood pressure reading without diagnosis of hypertension   . Wrist pain, left 04/21/2020    Past Surgical History:  Procedure Laterality Date  . WISDOM TOOTH EXTRACTION      Family History  Problem Relation Age of Onset  . Asthma Father   . Hypertension Mother   . Diabetes Maternal Grandmother   . Hypertension Maternal Grandmother   . Alcohol abuse Paternal Grandmother     Social History   Socioeconomic History  . Marital status: Married    Spouse name: Not on file  . Number of children: Not on file  . Years of education: Not on file  . Highest education level: Not on file  Occupational History  . Occupation: Systems developer  Tobacco Use  . Smoking status: Never Smoker  . Smokeless tobacco: Never Used  Vaping Use  . Vaping Use: Never used  Substance and Sexual Activity  . Alcohol use: Yes    Alcohol/week: 0.0 standard drinks    Comment: rarely  . Drug use: No  .  Sexual activity: Yes  Other Topics Concern  . Not on file  Social History Narrative   Married, has 33 year old and 66 year old and mother in law with health issues   Social Determinants of Health   Financial Resource Strain:   . Difficulty of Paying Living Expenses: Not on file  Food Insecurity:   . Worried About Programme researcher, broadcasting/film/video in the Last Year: Not on file  . Ran Out of Food in the Last Year: Not on file  Transportation Needs:   . Lack of Transportation (Medical): Not on file  . Lack of Transportation (Non-Medical): Not on file  Physical Activity:   . Days of Exercise per Week: Not on file  . Minutes of Exercise per Session: Not on file  Stress:   . Feeling of Stress : Not on file  Social Connections:   . Frequency of Communication with Friends and Family: Not on file  . Frequency of Social Gatherings with Friends and Family: Not on file  . Attends Religious Services: Not on file  . Active Member of Clubs or Organizations: Not on file  . Attends Banker Meetings: Not on file  . Marital Status: Not on file  Intimate Partner Violence:   .  Fear of Current or Ex-Partner: Not on file  . Emotionally Abused: Not on file  . Physically Abused: Not on file  . Sexually Abused: Not on file    Outpatient Medications Prior to Visit  Medication Sig Dispense Refill  . albuterol (PROVENTIL HFA;VENTOLIN HFA) 108 (90 Base) MCG/ACT inhaler INHALE ONE PUFF EVERY 6 HOURS AS NEEDED WHEEZING 8.5 Inhaler 0  . budesonide-formoterol (SYMBICORT) 160-4.5 MCG/ACT inhaler Inhale 2 puffs into the lungs 2 (two) times daily. 1 Inhaler 12   No facility-administered medications prior to visit.    No Known Allergies  Review of Systems  Constitutional: Negative.   HENT: Negative.   Respiratory: Negative.   Cardiovascular: Negative.   Musculoskeletal: Negative.   Neurological: Negative for dizziness and headaches.  Psychiatric/Behavioral:       Positive anxiety- see HPI        Objective:    Physical Exam Vitals reviewed.  HENT:     Head: Normocephalic.  Cardiovascular:     Rate and Rhythm: Normal rate and regular rhythm.     Pulses: Normal pulses.     Heart sounds: Normal heart sounds.  Pulmonary:     Effort: Pulmonary effort is normal.     Breath sounds: Normal breath sounds.  Skin:    General: Skin is warm and dry.  Neurological:     General: No focal deficit present.     Mental Status: He is alert and oriented to person, place, and time.  Psychiatric:        Behavior: Behavior normal.        Thought Content: Thought content normal.        Judgment: Judgment normal.     Comments: He does not appear anxious.      BP (!) 150/110   Pulse (!) 105   Temp 98.4 F (36.9 C) (Oral)   Ht 5\' 5"  (1.651 m)   Wt 223 lb (101.2 kg)   SpO2 97%   BMI 37.11 kg/m  Wt Readings from Last 3 Encounters:  10/10/20 223 lb (101.2 kg)  04/21/20 229 lb (103.9 kg)  05/08/19 213 lb (96.6 kg)   Pulse Readings from Last 3 Encounters:  10/10/20 (!) 105  04/21/20 94  10/13/18 90    BP Readings from Last 3 Encounters:  10/10/20 (!) 150/110  04/21/20 122/82  10/13/18 130/88    Lab Results  Component Value Date   CHOL 225 (H) 10/13/2018   HDL 37.90 (L) 10/13/2018   LDLCALC 101 (H) 09/27/2015   LDLDIRECT 123.0 10/13/2018   TRIG (H) 10/13/2018    461.0 Triglyceride is over 400; calculations on Lipids are invalid.   CHOLHDL 6 10/13/2018      Health Maintenance Due  Topic Date Due  . Hepatitis C Screening  Never done  . HIV Screening  Never done    There are no preventive care reminders to display for this patient.  Lab Results  Component Value Date   TSH 1.28 04/14/2019   Lab Results  Component Value Date   WBC 8.3 04/14/2019   HGB 16.0 04/14/2019   HCT 47.4 04/14/2019   MCV 84.6 04/14/2019   PLT 268.0 04/14/2019   Lab Results  Component Value Date   NA 138 04/14/2019   K 4.7 04/14/2019   CO2 29 04/14/2019   GLUCOSE 105 (H) 04/14/2019    BUN 11 04/14/2019   CREATININE 1.16 04/14/2019   BILITOT 0.8 04/14/2019   ALKPHOS 76 04/14/2019   AST 24 04/14/2019  ALT 46 04/14/2019   PROT 7.8 04/14/2019   ALBUMIN 4.9 04/14/2019   CALCIUM 10.3 04/14/2019   GFR 88.63 04/14/2019   Lab Results  Component Value Date   CHOL 225 (H) 10/13/2018   Lab Results  Component Value Date   HDL 37.90 (L) 10/13/2018   Lab Results  Component Value Date   LDLCALC 101 (H) 09/27/2015   Lab Results  Component Value Date   TRIG (H) 10/13/2018    461.0 Triglyceride is over 400; calculations on Lipids are invalid.   Lab Results  Component Value Date   CHOLHDL 6 10/13/2018   Lab Results  Component Value Date   HGBA1C 6.5 04/14/2019      Assessment & Plan:   Problem List Items Addressed This Visit      Other   Elevated BP without diagnosis of hypertension - Primary     BP is very elevated today- mildly anxious after intense job interviews today. This is not typical for him.   Please go home and rest and relax today. No caffeine . Avoid all ibuprofen products.   You are due for blood work- and prefer to come back next week. We can do your physical and recheck your BP and get labs at that time.   No orders of the defined types were placed in this encounter.   Follow-up: Return in about 1 week (around 10/17/2020) for CPE w kim will need labs so not 4:30 .  This visit occurred during the SARS-CoV-2 public health emergency.  Safety protocols were in place, including screening questions prior to the visit, additional usage of staff PPE, and extensive cleaning of exam room while observing appropriate contact time as indicated for disinfecting solutions.   Amedeo Kinsman, NP

## 2020-10-11 ENCOUNTER — Encounter: Payer: Self-pay | Admitting: Nurse Practitioner

## 2020-10-11 DIAGNOSIS — R030 Elevated blood-pressure reading, without diagnosis of hypertension: Secondary | ICD-10-CM | POA: Insufficient documentation

## 2020-10-17 ENCOUNTER — Encounter: Payer: BC Managed Care – PPO | Admitting: Nurse Practitioner

## 2020-10-19 ENCOUNTER — Other Ambulatory Visit: Payer: Self-pay

## 2020-10-24 ENCOUNTER — Other Ambulatory Visit: Payer: Self-pay

## 2020-10-24 ENCOUNTER — Encounter: Payer: Self-pay | Admitting: Nurse Practitioner

## 2020-10-24 ENCOUNTER — Ambulatory Visit (INDEPENDENT_AMBULATORY_CARE_PROVIDER_SITE_OTHER): Payer: BC Managed Care – PPO | Admitting: Nurse Practitioner

## 2020-10-24 VITALS — BP 118/82 | HR 98 | Temp 98.5°F | Ht 65.0 in | Wt 223.0 lb

## 2020-10-24 DIAGNOSIS — R7303 Prediabetes: Secondary | ICD-10-CM | POA: Diagnosis not present

## 2020-10-24 DIAGNOSIS — Z0001 Encounter for general adult medical examination with abnormal findings: Secondary | ICD-10-CM | POA: Diagnosis not present

## 2020-10-24 DIAGNOSIS — J4520 Mild intermittent asthma, uncomplicated: Secondary | ICD-10-CM

## 2020-10-24 DIAGNOSIS — R030 Elevated blood-pressure reading, without diagnosis of hypertension: Secondary | ICD-10-CM | POA: Diagnosis not present

## 2020-10-24 DIAGNOSIS — Z6835 Body mass index (BMI) 35.0-35.9, adult: Secondary | ICD-10-CM | POA: Insufficient documentation

## 2020-10-24 DIAGNOSIS — Z6837 Body mass index (BMI) 37.0-37.9, adult: Secondary | ICD-10-CM | POA: Diagnosis not present

## 2020-10-24 LAB — CBC WITH DIFFERENTIAL/PLATELET
Basophils Absolute: 0 10*3/uL (ref 0.0–0.1)
Basophils Relative: 0.4 % (ref 0.0–3.0)
Eosinophils Absolute: 0.3 10*3/uL (ref 0.0–0.7)
Eosinophils Relative: 2.4 % (ref 0.0–5.0)
HCT: 46.6 % (ref 39.0–52.0)
Hemoglobin: 15.7 g/dL (ref 13.0–17.0)
Lymphocytes Relative: 43.6 % (ref 12.0–46.0)
Lymphs Abs: 4.8 10*3/uL — ABNORMAL HIGH (ref 0.7–4.0)
MCHC: 33.8 g/dL (ref 30.0–36.0)
MCV: 83.1 fl (ref 78.0–100.0)
Monocytes Absolute: 0.8 10*3/uL (ref 0.1–1.0)
Monocytes Relative: 7.6 % (ref 3.0–12.0)
Neutro Abs: 5 10*3/uL (ref 1.4–7.7)
Neutrophils Relative %: 46 % (ref 43.0–77.0)
Platelets: 286 10*3/uL (ref 150.0–400.0)
RBC: 5.61 Mil/uL (ref 4.22–5.81)
RDW: 13.5 % (ref 11.5–15.5)
WBC: 10.9 10*3/uL — ABNORMAL HIGH (ref 4.0–10.5)

## 2020-10-24 LAB — VITAMIN D 25 HYDROXY (VIT D DEFICIENCY, FRACTURES): VITD: 20.34 ng/mL — ABNORMAL LOW (ref 30.00–100.00)

## 2020-10-24 LAB — COMPREHENSIVE METABOLIC PANEL
ALT: 42 U/L (ref 0–53)
AST: 20 U/L (ref 0–37)
Albumin: 4.9 g/dL (ref 3.5–5.2)
Alkaline Phosphatase: 89 U/L (ref 39–117)
BUN: 10 mg/dL (ref 6–23)
CO2: 30 mEq/L (ref 19–32)
Calcium: 10.2 mg/dL (ref 8.4–10.5)
Chloride: 100 mEq/L (ref 96–112)
Creatinine, Ser: 0.91 mg/dL (ref 0.40–1.50)
GFR: 111.2 mL/min (ref >60.00)
Glucose, Bld: 93 mg/dL (ref 70–99)
Potassium: 3.9 mEq/L (ref 3.5–5.1)
Sodium: 140 mEq/L (ref 135–145)
Total Bilirubin: 0.4 mg/dL (ref 0.2–1.2)
Total Protein: 7.8 g/dL (ref 6.0–8.3)

## 2020-10-24 LAB — LIPID PANEL
Cholesterol: 203 mg/dL — ABNORMAL HIGH (ref 0–200)
HDL: 44.2 mg/dL (ref >39.00)
NonHDL: 158.33
Total CHOL/HDL Ratio: 5
Triglycerides: 358 mg/dL — ABNORMAL HIGH (ref 0.0–149.0)
VLDL: 71.6 mg/dL — ABNORMAL HIGH (ref 0.0–40.0)

## 2020-10-24 LAB — B12 AND FOLATE PANEL
Folate: 20.8 ng/mL (ref >5.9)
Vitamin B-12: 968 pg/mL — ABNORMAL HIGH (ref 211–911)

## 2020-10-24 LAB — TSH: TSH: 1.06 u[IU]/mL (ref 0.35–4.50)

## 2020-10-24 LAB — HEMOGLOBIN A1C: Hgb A1c MFr Bld: 6.6 % — ABNORMAL HIGH (ref 4.6–6.5)

## 2020-10-24 LAB — LDL CHOLESTEROL, DIRECT: Direct LDL: 110 mg/dL

## 2020-10-24 NOTE — Progress Notes (Signed)
Established Patient Office Visit  Subjective:  Patient ID: Brandon Marsh, male    DOB: 1986-01-20  Age: 34 y.o. MRN: [id number]  CC:  Chief Complaint  Patient presents with  . Annual Exam    CPE    HPI OTTIS VACHA is a 34 yo with presents for a complete physical. He is feeling well and has no specific concerns.   Immunizations:Covid Pfizer 02/11/20 and 03/14/20. Tdap 2014-UTD. Declines flu and will be due a Covid booster.  Diet: Trying to eat healthier- still needs  improvement.  Exercise: Yes, gym use Colonoscopy: NA age 23 PSA: Age 6 NA  Dentist: UTD every 6 months Vision: Last year and due this year- wears glasses on the computer Non smoking history ETOH: rare  Elevated  BP without HTN: He is trying to eat a healthier diet with less salt and caffeine.   BP Readings from Last 3 Encounters:  10/24/20 118/82  10/10/20 (!) 150/110  04/21/20 122/82    BMI 37/Obesity/HLD/pre diabetes: A1c 6.5 04/14/2019. He started with Wt 224 lbs and lost to 216 lbs with phentermine and a diet. He  has gained it back when he stopped the medication. His goal wt is 180's. Needs repeat A1c. Lipids elevated. Needs repeated.    Wt Readings from Last 3 Encounters:  10/24/20 223 lb (101.2 kg)  10/10/20 223 lb (101.2 kg)  04/21/20 229 lb (103.9 kg)     Past Medical History:  Diagnosis Date  . Alopecia   . Anxiety and depression 04/21/2020  . Asthma   . Elevated blood pressure reading without diagnosis of hypertension   . Wrist pain, left 04/21/2020    Past Surgical History:  Procedure Laterality Date  . WISDOM TOOTH EXTRACTION      Family History  Problem Relation Age of Onset  . Asthma Father   . Hypertension Mother   . Diabetes Maternal Grandmother   . Hypertension Maternal Grandmother   . Alcohol abuse Paternal Grandmother     Social History   Socioeconomic History  . Marital status: Married    Spouse name: Not on file  . Number of children: Not on file  .  Years of education: Not on file  . Highest education level: Not on file  Occupational History  . Occupation: Systems developer  Tobacco Use  . Smoking status: Never Smoker  . Smokeless tobacco: Never Used  Vaping Use  . Vaping Use: Never used  Substance and Sexual Activity  . Alcohol use: Yes    Alcohol/week: 0.0 standard drinks    Comment: rarely  . Drug use: No  . Sexual activity: Yes  Other Topics Concern  . Not on file  Social History Narrative   Married, has 33 year old and 81 year old and mother in law with health issues   Social Determinants of Health   Financial Resource Strain:   . Difficulty of Paying Living Expenses: Not on file  Food Insecurity:   . Worried About Programme researcher, broadcasting/film/video in the Last Year: Not on file  . Ran Out of Food in the Last Year: Not on file  Transportation Needs:   . Lack of Transportation (Medical): Not on file  . Lack of Transportation (Non-Medical): Not on file  Physical Activity:   . Days of Exercise per Week: Not on file  . Minutes of Exercise per Session: Not on file  Stress:   . Feeling of Stress : Not on file  Social Connections:   .  Frequency of Communication with Friends and Family: Not on file  . Frequency of Social Gatherings with Friends and Family: Not on file  . Attends Religious Services: Not on file  . Active Member of Clubs or Organizations: Not on file  . Attends Banker Meetings: Not on file  . Marital Status: Not on file  Intimate Partner Violence:   . Fear of Current or Ex-Partner: Not on file  . Emotionally Abused: Not on file  . Physically Abused: Not on file  . Sexually Abused: Not on file    Outpatient Medications Prior to Visit  Medication Sig Dispense Refill  . albuterol (PROVENTIL HFA;VENTOLIN HFA) 108 (90 Base) MCG/ACT inhaler INHALE ONE PUFF EVERY 6 HOURS AS NEEDED WHEEZING 8.5 Inhaler 0  . budesonide-formoterol (SYMBICORT) 160-4.5 MCG/ACT inhaler Inhale 2 puffs into the lungs 2 (two)  times daily. 1 Inhaler 12   No facility-administered medications prior to visit.    No Known Allergies  Review of Systems  Constitutional: Negative.   HENT: Negative.   Eyes: Negative.   Respiratory: Negative.        No asthma concerns.   Cardiovascular: Negative.   Gastrointestinal: Negative.   Endocrine: Negative.   Genitourinary: Negative.   Musculoskeletal:       Wrist and knee doing well now.   Allergic/Immunologic: Negative.   Neurological: Negative.   Hematological: Negative.   Psychiatric/Behavioral:       Anxiety is improved.       Objective:    Physical Exam Vitals reviewed.  Constitutional:      Appearance: He is obese.  HENT:     Head: Normocephalic and atraumatic.     Right Ear: Tympanic membrane normal.     Left Ear: Tympanic membrane normal.  Eyes:     Conjunctiva/sclera: Conjunctivae normal.     Pupils: Pupils are equal, round, and reactive to light.  Cardiovascular:     Rate and Rhythm: Normal rate and regular rhythm.     Pulses: Normal pulses.     Heart sounds: Normal heart sounds.  Pulmonary:     Effort: Pulmonary effort is normal.     Breath sounds: Normal breath sounds.  Abdominal:     Palpations: Abdomen is soft.     Tenderness: There is no abdominal tenderness.  Musculoskeletal:        General: Normal range of motion.     Cervical back: Normal range of motion and neck supple.  Skin:    General: Skin is warm.  Neurological:     General: No focal deficit present.     Mental Status: He is alert and oriented to person, place, and time.  Psychiatric:        Mood and Affect: Mood normal.        Behavior: Behavior normal.     BP 118/82 (BP Location: Left Arm, Patient Position: Sitting, Cuff Size: Normal)   Pulse 98   Temp 98.5 F (36.9 C) (Oral)   Ht 5\' 5"  (1.651 m)   Wt 223 lb (101.2 kg)   SpO2 98%   BMI 37.11 kg/m  Wt Readings from Last 3 Encounters:  10/24/20 223 lb (101.2 kg)  10/10/20 223 lb (101.2 kg)  04/21/20 229 lb  (103.9 kg)   Pulse Readings from Last 3 Encounters:  10/24/20 98  10/10/20 (!) 105  04/21/20 94    BP Readings from Last 3 Encounters:  10/24/20 118/82  10/10/20 (!) 150/110  04/21/20 122/82    Lab  Results  Component Value Date   CHOL 225 (H) 10/13/2018   HDL 37.90 (L) 10/13/2018   LDLCALC 101 (H) 09/27/2015   LDLDIRECT 123.0 10/13/2018   TRIG (H) 10/13/2018    461.0 Triglyceride is over 400; calculations on Lipids are invalid.   CHOLHDL 6 10/13/2018      Health Maintenance Due  Topic Date Due  . Hepatitis C Screening  Never done    There are no preventive care reminders to display for this patient.  Lab Results  Component Value Date   TSH 1.28 04/14/2019   Lab Results  Component Value Date   WBC 8.3 04/14/2019   HGB 16.0 04/14/2019   HCT 47.4 04/14/2019   MCV 84.6 04/14/2019   PLT 268.0 04/14/2019   Lab Results  Component Value Date   NA 138 04/14/2019   K 4.7 04/14/2019   CO2 29 04/14/2019   GLUCOSE 105 (H) 04/14/2019   BUN 11 04/14/2019   CREATININE 1.16 04/14/2019   BILITOT 0.8 04/14/2019   ALKPHOS 76 04/14/2019   AST 24 04/14/2019   ALT 46 04/14/2019   PROT 7.8 04/14/2019   ALBUMIN 4.9 04/14/2019   CALCIUM 10.3 04/14/2019   GFR 88.63 04/14/2019   Lab Results  Component Value Date   CHOL 225 (H) 10/13/2018   Lab Results  Component Value Date   HDL 37.90 (L) 10/13/2018   Lab Results  Component Value Date   LDLCALC 101 (H) 09/27/2015   Lab Results  Component Value Date   TRIG (H) 10/13/2018    461.0 Triglyceride is over 400; calculations on Lipids are invalid.   Lab Results  Component Value Date   CHOLHDL 6 10/13/2018   Lab Results  Component Value Date   HGBA1C 6.5 04/14/2019      Assessment & Plan:   Problem List Items Addressed This Visit      Other   Elevated BP without diagnosis of hypertension   Pre-diabetes   Relevant Orders   Comprehensive metabolic panel   Hemoglobin A1c   Lipid panel   Microalbumin /  creatinine urine ratio   BMI 37.0-37.9, adult   Relevant Orders   TSH   VITAMIN D 25 Hydroxy (Vit-D Deficiency, Fractures)   B12 and Folate Panel    Other Visit Diagnoses    Encounter for preventative adult health care exam with abnormal findings    -  Primary   Relevant Orders   CBC with Differential/Platelet   Hepatitis C antibody   Microalbumin / creatinine urine ratio     Wellness exam:  1. Diet and Exercise: I discussed the importance of a healthy lifestyle including appropriate food choices and regular exercise. I recommended a diet that is moderate in fiber with fresh fruits and vegetables and low in saturated fats and simple carbohydrates. I recommended getting exercise daily and vigorous exercise 3 times per week to include a total of at least 150 minutes per week.   2. Vaccines/Immunizations: The patient is up to date on age-appropriate screening exams and vaccines. Declines FLU    vaccines at this time.  3. Given  dietary information sheets: Low glycemic index diet.    Your lab work reflects that you are in the pre-diabetes range, if a1c is between 5.7 to 6.4%. A normal fasting blood glucose is less than 100.  A fasting blood glucose greater than 126 is diagnostic of diabetes.  It is very important to maintain an exercise program and limit processed, sugary foods. If at  any point you would a referral a nutritionist, please let me know.   Follow-up: No follow-ups on file.  This visit occurred during the SARS-CoV-2 public health emergency.  Safety protocols were in place, including screening questions prior to the visit, additional usage of staff PPE, and extensive cleaning of exam room while observing appropriate contact time as indicated for disinfecting solutions.     Amedeo Kinsman, NP

## 2020-10-24 NOTE — Patient Instructions (Addendum)
Please go to the lab today for routine physical labs.   Wellness exam:  1. Diet and Exercise: I discussed the importance of a healthy lifestyle including appropriate food choices and regular exercise. I recommended a diet that is moderate in fiber with fresh fruits and vegetables and low in saturated fats and simple carbohydrates. I recommended getting exercise daily and vigorous exercise 3 times per week to include a total of at least 150 minutes per week.   2. Vaccines/Immunizations: The patient is up to date on age-appropriate screening exams and vaccines. Declines FLU    vaccines at this time.  3. Given  dietary information sheets: Low glycemic index diet.    Your lab work reflects that you are in the pre-diabetes range, if a1c is between 5.7 to 6.4%. A normal fasting blood glucose is less than 100.  A fasting blood glucose greater than 126 is diagnostic of diabetes.  It is very important to maintain an exercise program and limit processed, sugary foods. If at any point you would a referral a nutritionist, please let me know.     Preventive Care 79-34 Years Old, Male Preventive care refers to lifestyle choices and visits with your health care provider that can promote health and wellness. This includes:  A yearly physical exam. This is also called an annual well check.  Regular dental and eye exams.  Immunizations.  Screening for certain conditions.  Healthy lifestyle choices, such as eating a healthy diet, getting regular exercise, not using drugs or products that contain nicotine and tobacco, and limiting alcohol use. What can I expect for my preventive care visit? Physical exam Your health care provider will check:  Height and weight. These may be used to calculate body mass index (BMI), which is a measurement that tells if you are at a healthy weight.  Heart rate and blood pressure.  Your skin for abnormal spots. Counseling Your health care provider may ask you questions  about:  Alcohol, tobacco, and drug use.  Emotional well-being.  Home and relationship well-being.  Sexual activity.  Eating habits.  Work and work Statistician. What immunizations do I need?  Influenza (flu) vaccine  This is recommended every year. Tetanus, diphtheria, and pertussis (Tdap) vaccine  You may need a Td booster every 10 years. Varicella (chickenpox) vaccine  You may need this vaccine if you have not already been vaccinated. Zoster (shingles) vaccine  You may need this after age 67. Measles, mumps, and rubella (MMR) vaccine  You may need at least one dose of MMR if you were born in 1957 or later. You may also need a second dose. Pneumococcal conjugate (PCV13) vaccine  You may need this if you have certain conditions and were not previously vaccinated. Pneumococcal polysaccharide (PPSV23) vaccine  You may need one or two doses if you smoke cigarettes or if you have certain conditions. Meningococcal conjugate (MenACWY) vaccine  You may need this if you have certain conditions. Hepatitis A vaccine  You may need this if you have certain conditions or if you travel or work in places where you may be exposed to hepatitis A. Hepatitis B vaccine  You may need this if you have certain conditions or if you travel or work in places where you may be exposed to hepatitis B. Haemophilus influenzae type b (Hib) vaccine  You may need this if you have certain risk factors. Human papillomavirus (HPV) vaccine  If recommended by your health care provider, you may need three doses over 6  months. You may receive vaccines as individual doses or as more than one vaccine together in one shot (combination vaccines). Talk with your health care provider about the risks and benefits of combination vaccines. What tests do I need? Blood tests  Lipid and cholesterol levels. These may be checked every 5 years, or more frequently if you are over 88 years old.  Hepatitis C  test.  Hepatitis B test. Screening  Lung cancer screening. You may have this screening every year starting at age 50 if you have a 30-pack-year history of smoking and currently smoke or have quit within the past 15 years.  Prostate cancer screening. Recommendations will vary depending on your family history and other risks.  Colorectal cancer screening. All adults should have this screening starting at age 57 and continuing until age 63. Your health care provider may recommend screening at age 43 if you are at increased risk. You will have tests every 1-10 years, depending on your results and the type of screening test.  Diabetes screening. This is done by checking your blood sugar (glucose) after you have not eaten for a while (fasting). You may have this done every 1-3 years.  Sexually transmitted disease (STD) testing. Follow these instructions at home: Eating and drinking  Eat a diet that includes fresh fruits and vegetables, whole grains, lean protein, and low-fat dairy products.  Take vitamin and mineral supplements as recommended by your health care provider.  Do not drink alcohol if your health care provider tells you not to drink.  If you drink alcohol: ? Limit how much you have to 0-2 drinks a day. ? Be aware of how much alcohol is in your drink. In the [place] one drink equals one 12 oz bottle of beer (355 mL), one 5 oz glass of wine (148 mL), or one 1 oz glass of hard liquor (44 mL). Lifestyle  Take daily care of your teeth and gums.  Stay active. Exercise for at least 30 minutes on 5 or more days each week.  Do not use any products that contain nicotine or tobacco, such as cigarettes, e-cigarettes, and chewing tobacco. If you need help quitting, ask your health care provider.  If you are sexually active, practice safe sex. Use a condom or other form of protection to prevent STIs (sexually transmitted infections).  Talk with your health care provider about taking a  low-dose aspirin every day starting at age 45. What's next?  Go to your health care provider once a year for a well check visit.  Ask your health care provider how often you should have your eyes and teeth checked.  Stay up to date on all vaccines. This information is not intended to replace advice given to you by your health care provider. Make sure you discuss any questions you have with your health care provider. Document Revised: 11/13/2018 Document Reviewed: 11/13/2018 Elsevier Patient Education  [place].  Prediabetes Prediabetes is the condition of having a blood sugar (blood glucose) level that is higher than it should be, but not high enough for you to be diagnosed with type 2 diabetes. Having prediabetes puts you at risk for developing type 2 diabetes (type 2 diabetes mellitus). Prediabetes may be called impaired glucose tolerance or impaired fasting glucose. Prediabetes usually does not cause symptoms. Your health care provider can diagnose this condition with blood tests. You may be tested for prediabetes if you are overweight and if you have at least one other risk factor for prediabetes.  What is blood glucose, and how is it measured? Blood glucose refers to the amount of glucose in your bloodstream. Glucose comes from eating foods that contain sugars and starches (carbohydrates), which the body breaks down into glucose. Your blood glucose level may be measured in mg/dL (milligrams per deciliter) or mmol/L (millimoles per liter). Your blood glucose may be checked with one or more of the following blood tests:  A fasting blood glucose (FBG) test. You will not be allowed to eat (you will fast) for 8 hours or longer before a blood sample is taken. ? A normal range for FBG is 70-100 mg/dl (3.9-5.6 mmol/L).  An A1c (hemoglobin A1c) blood test. This test provides information about blood glucose control over the previous 2?57month.  An oral glucose tolerance test (OGTT). This  test measures your blood glucose at two times: ? After fasting. This is your baseline level. ? Two hours after you drink a beverage that contains glucose. You may be diagnosed with prediabetes:  If your FBG is 100?125 mg/dL (5.6-6.9 mmol/L).  If your A1c level is 5.7?6.4%.  If your OGTT result is 140?199 mg/dL (7.8-11 mmol/L). These blood tests may be repeated to confirm your diagnosis. How can this condition affect me? The pancreas produces a hormone (insulin) that helps to move glucose from the bloodstream into cells. When cells in the body do not respond properly to insulin that the body makes (insulin resistance), excess glucose builds up in the blood instead of going into cells. As a result, high blood glucose (hyperglycemia) can develop, which can cause many complications. Hyperglycemia is a symptom of prediabetes. Having high blood glucose for a long time is dangerous. Too much glucose in your blood can damage your nerves and blood vessels. Long-term damage can lead to complications from diabetes, which may include:  Heart disease.  Stroke.  Blindness.  Kidney disease.  Depression.  Poor circulation in the feet and legs, which could lead to surgical removal (amputation) in severe cases. What can increase my risk? Risk factors for prediabetes include:  Having a family member with type 2 diabetes.  Being overweight or obese.  Being older than age 658  Being of [place] African-American, Hispanic/Latino, or Asian/Pacific Islander descent.  Having an inactive (sedentary) lifestyle.  Having a history of heart disease.  History of gestational diabetes or polycystic ovary syndrome (PCOS), in women.  Having low levels of good cholesterol (HDL-C) or high levels of blood fats (triglycerides).  Having high blood pressure. What actions can I take to prevent diabetes?      Be physically active. ? Do moderate-intensity physical activity for 30 or more minutes on 5  or more days of the week, or as much as told by your health care provider. This could be brisk walking, biking, or water aerobics. ? Ask your health care provider what activities are safe for you. A mix of physical activities may be best, such as walking, swimming, cycling, and strength training.  Lose weight as told by your health care provider. ? Losing 5-7% of your body weight can reverse insulin resistance. ? Your health care provider can determine how much weight loss is best for you and can help you lose weight safely.  Follow a healthy meal plan. This includes eating lean proteins, complex carbohydrates, fresh fruits and vegetables, low-fat dairy products, and healthy fats. ? Follow instructions from your health care provider about eating or drinking restrictions. ? Make an appointment to see a diet and nutrition specialist (registered  dietitian) to help you create a healthy eating plan that is right for you.  Do not smoke or use any tobacco products, such as cigarettes, chewing tobacco, and e-cigarettes. If you need help quitting, ask your health care provider.  Take over-the-counter and prescription medicines as told by your health care provider. You may be prescribed medicines that help lower the risk of type 2 diabetes.  Keep all follow-up visits as told by your health care provider. This is important. Summary  Prediabetes is the condition of having a blood sugar (blood glucose) level that is higher than it should be, but not high enough for you to be diagnosed with type 2 diabetes.  Having prediabetes puts you at risk for developing type 2 diabetes (type 2 diabetes mellitus).  To help prevent type 2 diabetes, make lifestyle changes such as being physically active and eating a healthy diet. Lose weight as told by your health care provider. This information is not intended to replace advice given to you by your health care provider. Make sure you discuss any questions you have with  your health care provider. Document Revised: 03/13/2019 Document Reviewed: 01/10/2016 Elsevier Patient Education  [place].

## 2020-10-25 LAB — MICROALBUMIN / CREATININE URINE RATIO
Creatinine,U: 146.8 mg/dL
Microalb Creat Ratio: 1.2 mg/g (ref 0.0–30.0)
Microalb, Ur: 1.8 mg/dL (ref 0.0–1.9)

## 2020-10-25 LAB — HEPATITIS C ANTIBODY
Hepatitis C Ab: NONREACTIVE
SIGNAL TO CUT-OFF: 0.03 (ref <1.00)

## 2020-10-28 MED ORDER — ALBUTEROL SULFATE HFA 108 (90 BASE) MCG/ACT IN AERS: INHALATION_SPRAY | RESPIRATORY_TRACT | 1 refills | Status: DC

## 2021-03-27 ENCOUNTER — Encounter: Payer: Self-pay | Admitting: Emergency Medicine

## 2021-03-27 ENCOUNTER — Other Ambulatory Visit: Payer: Self-pay

## 2021-03-27 ENCOUNTER — Ambulatory Visit
Admission: EM | Admit: 2021-03-27 | Discharge: 2021-03-27 | Disposition: A | Discharge status: Home / Self Care | Payer: BC Managed Care – PPO | Attending: Emergency Medicine | Admitting: Emergency Medicine

## 2021-03-27 DIAGNOSIS — J0100 Acute maxillary sinusitis, unspecified: Secondary | ICD-10-CM | POA: Diagnosis not present

## 2021-03-27 DIAGNOSIS — R0981 Nasal congestion: Secondary | ICD-10-CM | POA: Diagnosis not present

## 2021-03-27 DIAGNOSIS — Z7951 Long term (current) use of inhaled steroids: Secondary | ICD-10-CM | POA: Insufficient documentation

## 2021-03-27 DIAGNOSIS — R051 Acute cough: Secondary | ICD-10-CM | POA: Diagnosis not present

## 2021-03-27 DIAGNOSIS — Z20822 Contact with and (suspected) exposure to covid-19: Secondary | ICD-10-CM | POA: Insufficient documentation

## 2021-03-27 DIAGNOSIS — B349 Viral infection, unspecified: Secondary | ICD-10-CM | POA: Insufficient documentation

## 2021-03-27 DIAGNOSIS — J029 Acute pharyngitis, unspecified: Secondary | ICD-10-CM | POA: Insufficient documentation

## 2021-03-27 LAB — SARS CORONAVIRUS 2 (TAT 6-24 HRS): SARS Coronavirus 2: NEGATIVE

## 2021-03-27 MED ORDER — AMOXICILLIN-POT CLAVULANATE 875-125 MG PO TABS: 1.0000 | ORAL_TABLET | Freq: Two times a day (BID) | ORAL | 0 refills | Status: DC

## 2021-03-27 NOTE — ED Provider Notes (Signed)
[facility] ____________________________________________  Time seen: Approximately 10:07 AM  I have reviewed the triage vital signs and the nursing notes.   HISTORY  Chief Complaint Sore Throat, Nasal Congestion, Cough, and Generalized Body Aches   HPI Brandon Marsh is a 35 y.o. male presenting for evaluation of 1 week of nasal congestion, postnasal drainage, sore throat and intermittent cough.  Has had intermittent accompanying body aches, denies known fevers.  States his daughter had similar.  Did a home COVID-19 test that was negative.  Has had continued congestion with drainage.  Some sinus pressure.  Continues eat and drink well.  Has been taken over-the-counter Mucinex without resolution.  Denies chest pain, shortness of breath, vomiting, diarrhea.  Denies other recent sickness.   Past Medical History:  Diagnosis Date  . Alopecia   . Anxiety and depression 04/21/2020  . Asthma   . Elevated blood pressure reading without diagnosis of hypertension   . Wrist pain, left 04/21/2020    Patient Active Problem List   Diagnosis Date Noted  . Class 2 severe obesity due to excess calories with serious comorbidity and body mass index (BMI) of 35.0 to 35.9 in adult [facility]) 10/24/2020  . Pre-diabetes 10/24/2020  . BMI 37.0-37.9, adult 10/24/2020  . Encounter for preventative adult health care exam with abnormal findings 10/24/2020  . Elevated BP without diagnosis of hypertension 10/11/2020  . Anxiety 04/21/2020  . Wrist pain, left 04/21/2020  . Knee pain, right 05/05/2015  . Asthma     Past Surgical History:  Procedure Laterality Date  . WISDOM TOOTH EXTRACTION       No current facility-administered medications for this encounter.  Current Outpatient Medications:  .  amoxicillin-clavulanate (AUGMENTIN) 875-125 MG tablet, Take 1 tablet by mouth every 12 (twelve) hours., Disp: 20 tablet, Rfl: 0 .  albuterol (VENTOLIN HFA) 108 (90 Base) MCG/ACT inhaler, INHALE ONE  PUFF EVERY 6 HOURS AS NEEDED WHEEZING, Disp: 1 each, Rfl: 1 .  budesonide-formoterol (SYMBICORT) 160-4.5 MCG/ACT inhaler, Inhale 2 puffs into the lungs 2 (two) times daily., Disp: 1 Inhaler, Rfl: 12  Allergies Patient has no known allergies.  Family History  Problem Relation Age of Onset  . Asthma Father   . Hypertension Mother   . Diabetes Maternal Grandmother   . Hypertension Maternal Grandmother   . Alcohol abuse Paternal Grandmother     Social History Social History   Tobacco Use  . Smoking status: Never Smoker  . Smokeless tobacco: Never Used  Vaping Use  . Vaping Use: Never used  Substance Use Topics  . Alcohol use: Yes    Alcohol/week: 0.0 standard drinks    Comment: rarely  . Drug use: No    Review of Systems Constitutional: No fever Eyes: No visual changes. ENT: Positive sore throat.  Positive congestion. Cardiovascular: Denies chest pain. Respiratory: Denies shortness of breath. Gastrointestinal: No abdominal pain.  No vomiting.  Some diarrhea.   Musculoskeletal: Negative for back pain. Skin: Negative for rash.   ____________________________________________   PHYSICAL EXAM:  VITAL SIGNS: ED Triage Vitals  Enc Vitals Group     BP 03/27/21 0905 (!) 141/92     Pulse Rate 03/27/21 0905 86     Resp 03/27/21 0905 17     Temp 03/27/21 0905 98.5 F (36.9 C)     Temp Source 03/27/21 0905 Oral     SpO2 03/27/21 0905 96 %     Weight --      Height --  Head Circumference --      Peak Flow --      Pain Score 03/27/21 0901 6     Pain Loc --      Pain Edu? --      Excl. in GC? --     Constitutional: Alert and oriented. Well appearing and in no acute distress. Eyes: Conjunctivae are normal. . ENT      Head: Normocephalic and atraumatic.  No frontal sinus tenderness palpation.  Mild bilateral maxillary sinus tenderness to palpation.      Ears: Nontender, normal canal, no erythema, normal TM.      Nose: Nasal congestion.      Mouth/Throat: Mucous  membranes are moist.Oropharynx non-erythematous.  No tonsillar swelling or exudate. Neck: No stridor. Supple without meningismus.  Hematological/Lymphatic/Immunilogical: No cervical lymphadenopathy. Cardiovascular: Normal rate, regular rhythm. Grossly normal heart sounds.  Good peripheral circulation. Respiratory: Normal respiratory effort without tachypnea nor retractions. Breath sounds are clear and equal bilaterally. No wheezes, rales, rhonchi. Musculoskeletal: Steady gait.  Neurologic:  Normal speech and language.  Skin:  Skin is warm, dry and intact. No rash noted. Psychiatric: Mood and affect are normal. Speech and behavior are normal. Patient exhibits appropriate insight and judgment   ___________________________________________   LABS (all labs ordered are listed, but only abnormal results are displayed)  Labs Reviewed  SARS CORONAVIRUS 2 (TAT 6-24 HRS)     PROCEDURES Procedures    INITIAL IMPRESSION / ASSESSMENT AND PLAN / ED COURSE  Pertinent labs & imaging results that were available during my care of the patient were reviewed by me and considered in my medical decision making (see chart for details).  Overall well-appearing patient.  No acute distress.  COVID-19 test ordered.  Suspect recent viral illness with secondary sinusitis.  Will treat with oral Augmentin.  Continue over-the-counter cough congestion medication as needed.  Rest, fluids and supportive care.Discussed indication, risks and benefits of medications with patient.   Discussed follow up with Primary care physician this week as needed. Discussed follow up and return parameters including no resolution or any worsening concerns. Patient verbalized understanding and agreed to plan.   ____________________________________________   FINAL CLINICAL IMPRESSION(S) / ED DIAGNOSES  Final diagnoses:  Acute maxillary sinusitis, recurrence not specified  Viral illness     ED Discharge Orders         Ordered     amoxicillin-clavulanate (AUGMENTIN) 875-125 MG tablet  Every 12 hours        03/27/21 2831           Note: This dictation was prepared with Dragon dictation along with smaller phrase technology. Any transcriptional errors that result from this process are unintentional.         Renford Dills, NP 03/27/21 1010

## 2021-03-27 NOTE — ED Triage Notes (Signed)
Pt Is present with nasal congestion, sore throat, body aches, and a cough. Pt states that his sx started last Wednesday. Pt need a home covid test yesterday and the result was negative

## 2021-03-27 NOTE — Discharge Instructions (Addendum)
Take medication as prescribed. Rest. Drink plenty of fluids.  ° °Follow up with your primary care physician this week as needed. Return to Urgent care for new or worsening concerns.  ° °

## 2021-05-15 ENCOUNTER — Ambulatory Visit
Admission: EM | Admit: 2021-05-15 | Discharge: 2021-05-15 | Disposition: A | Discharge status: Home / Self Care | Payer: BC Managed Care – PPO | Attending: Family Medicine | Admitting: Family Medicine

## 2021-05-15 ENCOUNTER — Encounter: Payer: Self-pay | Admitting: Emergency Medicine

## 2021-05-15 ENCOUNTER — Other Ambulatory Visit: Payer: Self-pay

## 2021-05-15 DIAGNOSIS — Z1152 Encounter for screening for COVID-19: Secondary | ICD-10-CM

## 2021-05-15 DIAGNOSIS — J988 Other specified respiratory disorders: Secondary | ICD-10-CM | POA: Diagnosis not present

## 2021-05-15 DIAGNOSIS — B9789 Other viral agents as the cause of diseases classified elsewhere: Secondary | ICD-10-CM | POA: Diagnosis not present

## 2021-05-15 LAB — SARS CORONAVIRUS 2 (TAT 6-24 HRS): SARS Coronavirus 2: NEGATIVE

## 2021-05-15 MED ORDER — IPRATROPIUM BROMIDE 0.06 % NA SOLN: 2.0000 | Freq: Four times a day (QID) | NASAL | 0 refills | Status: DC | PRN

## 2021-05-15 MED ORDER — MELOXICAM 15 MG PO TABS: 15.0000 mg | ORAL_TABLET | Freq: Every day | ORAL | 0 refills | Status: DC | PRN

## 2021-05-15 MED ORDER — BUDESONIDE-FORMOTEROL FUMARATE 160-4.5 MCG/ACT IN AERO: 2.0000 | INHALATION_SPRAY | Freq: Two times a day (BID) | RESPIRATORY_TRACT | 12 refills | Status: DC

## 2021-05-15 NOTE — ED Provider Notes (Signed)
[facility]    CSN: [id number] Arrival date & time: 05/15/21  0825      History   Chief Complaint Chief Complaint  Patient presents with  . Nasal Congestion    cough  . Cough  . Generalized Body Aches   HPI 35 year old male presents with the above complaints.  Patient states that he has not been feeling well since Wednesday.  Started after he was outside working in the yard.  He reports congestion, sore throat, runny nose, and body aches.  He has felt febrile but has checked his temperature and he has not had a fever.  He rates his pain is 3/10 in severity.  He has been using some over-the-counter Zyrtec without resolution.  Patient has asthma and is requesting a refill on his Symbicort.  He is requesting COVID testing as well.  No reported sick contacts.  No other complaints concerns at this time.   Past Medical History:  Diagnosis Date  . Alopecia   . Anxiety and depression 04/21/2020  . Asthma   . Elevated blood pressure reading without diagnosis of hypertension   . Wrist pain, left 04/21/2020    Patient Active Problem List   Diagnosis Date Noted  . Class 2 severe obesity due to excess calories with serious comorbidity and body mass index (BMI) of 35.0 to 35.9 in adult [facility]) 10/24/2020  . Pre-diabetes 10/24/2020  . BMI 37.0-37.9, adult 10/24/2020  . Encounter for preventative adult health care exam with abnormal findings 10/24/2020  . Elevated BP without diagnosis of hypertension 10/11/2020  . Anxiety 04/21/2020  . Wrist pain, left 04/21/2020  . Knee pain, right 05/05/2015  . Asthma     Past Surgical History:  Procedure Laterality Date  . WISDOM TOOTH EXTRACTION         Home Medications    Prior to Admission medications   Medication Sig Start Date End Date Taking? Authorizing Provider  albuterol (VENTOLIN HFA) 108 (90 Base) MCG/ACT inhaler INHALE ONE PUFF EVERY 6 HOURS AS NEEDED WHEEZING 10/28/20  Yes Amedeo Kinsman A, NP  ipratropium  (ATROVENT) 0.06 % nasal spray Place 2 sprays into both nostrils 4 (four) times daily as needed for rhinitis. 05/15/21  Yes Mykal Kirchman G, DO  meloxicam (MOBIC) 15 MG tablet Take 1 tablet (15 mg total) by mouth daily as needed for pain. 05/15/21  Yes Nasiir Monts G, DO  budesonide-formoterol (SYMBICORT) 160-4.5 MCG/ACT inhaler Inhale 2 puffs into the lungs 2 (two) times daily. 05/15/21   Tommie Sams, DO    Family History Family History  Problem Relation Age of Onset  . Asthma Father   . Hypertension Mother   . Diabetes Maternal Grandmother   . Hypertension Maternal Grandmother   . Alcohol abuse Paternal Grandmother     Social History Social History   Tobacco Use  . Smoking status: Never  . Smokeless tobacco: Never  Vaping Use  . Vaping Use: Never used  Substance Use Topics  . Alcohol use: Yes    Alcohol/week: 0.0 standard drinks    Comment: rarely  . Drug use: No     Allergies   Patient has no known allergies.   Review of Systems Review of Systems Per HPI  Physical Exam Triage Vital Signs ED Triage Vitals  Enc Vitals Group     BP 05/15/21 0837 (!) 144/98     Pulse Rate 05/15/21 0837 97     Resp 05/15/21 0837 20     Temp 05/15/21 6144  98.9 F (37.2 C)     Temp Source 05/15/21 0837 Oral     SpO2 05/15/21 0837 100 %     Weight --      Height --      Head Circumference --      Peak Flow --      Pain Score 05/15/21 0834 3     Pain Loc --      Pain Edu? --      Excl. in [place]? --    Updated Vital Signs BP (!) 144/98 (BP Location: Right Arm)   Pulse 97   Temp 98.9 F (37.2 C) (Oral)   Resp 20   SpO2 100%   Visual Acuity Right Eye Distance:   Left Eye Distance:   Bilateral Distance:    Right Eye Near:   Left Eye Near:    Bilateral Near:     Physical Exam Constitutional:      General: He is not in acute distress.    Appearance: Normal appearance. He is obese. He is not ill-appearing.  HENT:     Head: Normocephalic and atraumatic.     Right Ear:  Tympanic membrane normal.     Left Ear: Tympanic membrane normal.     Nose: Congestion present.     Mouth/Throat:     Pharynx: Oropharynx is clear. No oropharyngeal exudate.  Cardiovascular:     Rate and Rhythm: Normal rate and regular rhythm.  Pulmonary:     Effort: Pulmonary effort is normal.     Breath sounds: Normal breath sounds. No wheezing or rales.  Neurological:     Mental Status: He is alert.     UC Treatments / Results  Labs (all labs ordered are listed, but only abnormal results are displayed) Labs Reviewed  SARS CORONAVIRUS 2 (TAT 6-24 HRS)    EKG   Radiology No results found.  Procedures Procedures (including critical care time)  Medications Ordered in UC Medications - No data to display  Initial Impression / Assessment and Plan / UC Course  I have reviewed the triage vital signs and the nursing notes.  Pertinent labs & imaging results that were available during my care of the patient were reviewed by me and considered in my medical decision making (see chart for details).    35 year old male presents with a viral respiratory infection.  His exam is notable for congestion.  Otherwise normal exam.  Overall well-appearing.  Treating symptomatically with Atrovent nasal spray.  Meloxicam for body aches.  Advised continued use of Zyrtec.  Awaiting COVID test results.  Supportive care.  Final Clinical Impressions(s) / UC Diagnoses   Final diagnoses:  Encounter for screening for COVID-19  Viral respiratory infection     Discharge Instructions      Rest. Fluids.  Continue Zyrtec.   Medication as prescribed for congestion/runny nose & body aches/pain.  COVID test will be back in the AM.  Take care  Dr. Adriana Simas    ED Prescriptions     Medication Sig Dispense Auth. Provider   budesonide-formoterol (SYMBICORT) 160-4.5 MCG/ACT inhaler Inhale 2 puffs into the lungs 2 (two) times daily. 1 each Everlene Other G, DO   ipratropium (ATROVENT) 0.06 % nasal  spray Place 2 sprays into both nostrils 4 (four) times daily as needed for rhinitis. 15 mL Nataniel Gasper G, DO   meloxicam (MOBIC) 15 MG tablet Take 1 tablet (15 mg total) by mouth daily as needed for pain. 30 tablet [place], [place], [place]  PDMP not reviewed this encounter.   Tommie Sams, DO 05/15/21 0930

## 2021-05-15 NOTE — Discharge Instructions (Addendum)
Rest. Fluids.  Continue Zyrtec.   Medication as prescribed for congestion/runny nose & body aches/pain.  COVID test will be back in the AM.  Take care  Dr. Adriana Simas

## 2021-05-15 NOTE — ED Triage Notes (Signed)
Pt presents today with c/o of runny nose, cough and body aches x 5 days. Has been taking Zyrtec OTC with minimal relief. Requests Covid test.

## 2021-06-11 ENCOUNTER — Other Ambulatory Visit: Payer: Self-pay | Admitting: Family Medicine

## 2021-09-02 ENCOUNTER — Encounter: Payer: Self-pay | Admitting: Emergency Medicine

## 2021-09-02 ENCOUNTER — Other Ambulatory Visit: Payer: Self-pay

## 2021-09-02 ENCOUNTER — Ambulatory Visit
Admission: EM | Admit: 2021-09-02 | Discharge: 2021-09-02 | Disposition: A | Discharge status: Home / Self Care | Payer: 59 | Attending: Emergency Medicine | Admitting: Emergency Medicine

## 2021-09-02 DIAGNOSIS — Z760 Encounter for issue of repeat prescription: Secondary | ICD-10-CM | POA: Diagnosis not present

## 2021-09-02 DIAGNOSIS — J45909 Unspecified asthma, uncomplicated: Secondary | ICD-10-CM

## 2021-09-02 DIAGNOSIS — U071 COVID-19: Secondary | ICD-10-CM | POA: Diagnosis not present

## 2021-09-02 MED ORDER — MOLNUPIRAVIR EUA 200MG CAPSULE: 4.0000 | ORAL_CAPSULE | Freq: Two times a day (BID) | ORAL | 0 refills | Status: AC

## 2021-09-02 MED ORDER — AEROCHAMBER PLUS MISC: 2 refills | Status: DC

## 2021-09-02 MED ORDER — ALBUTEROL SULFATE HFA 108 (90 BASE) MCG/ACT IN AERS: 1.0000 | INHALATION_SPRAY | RESPIRATORY_TRACT | 0 refills | Status: DC | PRN

## 2021-09-02 NOTE — ED Provider Notes (Signed)
HPI  SUBJECTIVE:  Brandon Marsh is a 35 y.o. male who presents with nasal congestion, body aches, headaches, cough, sore throat starting last night.  He had a positive home COVID test this morning.  No fevers, loss of sense of smell or taste, nausea, vomiting, diarrhea, abdominal pain, wheezing, change in his baseline shortness of breath, chest pain.  He is requesting a prescription of antivirals.  He got the COVID booster.  He has tried Mucinex with improvement in symptoms.  No aggravating factors.  He has a past medical history of asthma and is compliant with his albuterol and Symbicort.  No recent steroid use, admissions to the hospital for asthma.  He also has a BMI above 30.  PMD: [employer].  Past Medical History:  Diagnosis Date   Alopecia    Anxiety and depression 04/21/2020   Asthma    Elevated blood pressure reading without diagnosis of hypertension    Wrist pain, left 04/21/2020    Past Surgical History:  Procedure Laterality Date   WISDOM TOOTH EXTRACTION      Family History  Problem Relation Age of Onset   Asthma Father    Hypertension Mother    Diabetes Maternal Grandmother    Hypertension Maternal Grandmother    Alcohol abuse Paternal Grandmother     Social History   Tobacco Use   Smoking status: Never   Smokeless tobacco: Never  Vaping Use   Vaping Use: Never used  Substance Use Topics   Alcohol use: Yes    Alcohol/week: 0.0 standard drinks    Comment: rarely   Drug use: No    No current facility-administered medications for this encounter.  Current Outpatient Medications:    albuterol (VENTOLIN HFA) 108 (90 Base) MCG/ACT inhaler, Inhale 1-2 puffs into the lungs every 4 (four) hours as needed for wheezing or shortness of breath., Disp: 1 each, Rfl: 0   budesonide-formoterol (SYMBICORT) 160-4.5 MCG/ACT inhaler, Inhale 2 puffs into the lungs 2 (two) times daily., Disp: 1 each, Rfl: 12   ipratropium (ATROVENT) 0.06 % nasal spray, Place 2 sprays  into both nostrils 4 (four) times daily as needed for rhinitis., Disp: 15 mL, Rfl: 0   molnupiravir EUA (LAGEVRIO) 200 mg CAPS capsule, Take 4 capsules (800 mg total) by mouth 2 (two) times daily for 5 days., Disp: 40 capsule, Rfl: 0   Spacer/Aero-Holding Chambers (AEROCHAMBER PLUS) inhaler, Use with inhaler, Disp: 1 each, Rfl: 2  No Known Allergies   ROS  As noted in HPI.   Physical Exam  BP (!) 150/80 (BP Location: Left Arm)   Pulse 90   Temp 98.6 F (37 C) (Oral)   Resp 18   Ht 5\' 6"  (1.676 m)   Wt 99.3 kg   SpO2 100%   BMI 35.35 kg/m   Constitutional: Well developed, well nourished, no acute distress Eyes:  EOMI, conjunctiva normal bilaterally HENT: Normocephalic, atraumatic,mucus membranes moist Respiratory: Normal inspiratory effort, good air movement, lungs clear bilaterally. Cardiovascular: Normal rate, regular rhythm, no murmurs rubs or gallops. GI: nondistended skin: No rash, skin intact Musculoskeletal: no deformities Neurologic: Alert & oriented x 3, no focal neuro deficits Psychiatric: Speech and behavior appropriate   ED Course   Medications - No data to display  No orders of the defined types were placed in this encounter.   No results found for this or any previous visit (from the past 24 hour(s)). No results found.  ED Clinical Impression  1. COVID-19 virus infection  2. Asthma, unspecified asthma severity, unspecified whether complicated, unspecified whether persistent   3. Medication refill      ED Assessment/Plan  GFR 111 from 11/21.  Patient qualifies for COVID antivirals due to asthma, BMI above 30.  He has no history of kidney disease.  There is an interaction in between Symbicort and Paxlovid-patient does not want to discontinue Symbicort for the 5 days while he is taking Paxlovid so will send home with Molnupiravir.  Refilling albuterol inhaler, sending him home with a spacer.  Tylenol/ibuprofen, saline nasal irrigation, Mucinex.   Follow-up with PMD as needed.  ER return precautions given.  Discussed labs, MDM, treatment plan, and plan for follow-up with patient. Discussed sn/sx that should prompt return to the ED. patient agrees with plan.   Meds ordered this encounter  Medications   albuterol (VENTOLIN HFA) 108 (90 Base) MCG/ACT inhaler    Sig: Inhale 1-2 puffs into the lungs every 4 (four) hours as needed for wheezing or shortness of breath.    Dispense:  1 each    Refill:  0   Spacer/Aero-Holding Chambers (AEROCHAMBER PLUS) inhaler    Sig: Use with inhaler    Dispense:  1 each    Refill:  2    Please educate patient on use   molnupiravir EUA (LAGEVRIO) 200 mg CAPS capsule    Sig: Take 4 capsules (800 mg total) by mouth 2 (two) times daily for 5 days.    Dispense:  40 capsule    Refill:  0      *This clinic note was created using Scientist, clinical (histocompatibility and immunogenetics). Therefore, there may be occasional mistakes despite careful proofreading.  ?    Domenick Gong, MD 09/03/21 [phone]

## 2021-09-02 NOTE — ED Triage Notes (Signed)
Pt here with C/O positive covid test at home, has slight headache, slight runny nose, slight sore throat, slight body aches, would like medication to treat covid

## 2021-09-02 NOTE — Discharge Instructions (Addendum)
Use your spacer with your albuterol inhaler and Symbicort.  This will make them both much more effective.  Finish the [employer], even if you feel better.  May take 600 mg of ibuprofen combined with 1000 mg Tylenol together 3-4 times a day.  Mucinex, saline nasal irrigation with a [employer] sinus rinse and distilled water as often as you want, Atrovent nasal spray.

## 2021-11-09 ENCOUNTER — Ambulatory Visit
Admission: EM | Admit: 2021-11-09 | Discharge: 2021-11-09 | Disposition: A | Discharge status: Home / Self Care | Payer: 59 | Attending: Internal Medicine | Admitting: Internal Medicine

## 2021-11-09 ENCOUNTER — Other Ambulatory Visit: Payer: Self-pay

## 2021-11-09 DIAGNOSIS — L739 Follicular disorder, unspecified: Secondary | ICD-10-CM | POA: Diagnosis not present

## 2021-11-09 MED ORDER — DOXYCYCLINE HYCLATE 100 MG PO CAPS: 100.0000 mg | ORAL_CAPSULE | Freq: Two times a day (BID) | ORAL | 0 refills | Status: DC

## 2021-11-09 MED ORDER — CYPROHEPTADINE HCL 4 MG PO TABS: 4.0000 mg | ORAL_TABLET | Freq: Three times a day (TID) | ORAL | 0 refills | Status: DC | PRN

## 2021-11-09 NOTE — ED Provider Notes (Signed)
[facility]    CSN: [id number] Arrival date & time: 11/09/21  0919      History   Chief Complaint Chief Complaint  Patient presents with   Rash    HPI Brandon Marsh is a 35 y.o. male who presents with itchy rash all over x 2 days. Has been taking Benadryl and applying alovera but is not helping much. He tried some eczema cream from his son on his arm which helped the itching, but did not help the rash. Pt denies hx of eczema. He was cleaning his parents home which was infested with fleas and thought it was flea bites initially.  Denies use of anything new. He has been working out a lot but wears breathable clothes.     Past Medical History:  Diagnosis Date   Alopecia    Anxiety and depression 04/21/2020   Asthma    Elevated blood pressure reading without diagnosis of hypertension    Wrist pain, left 04/21/2020    Patient Active Problem List   Diagnosis Date Noted   Class 2 severe obesity due to excess calories with serious comorbidity and body mass index (BMI) of 35.0 to 35.9 in adult [facility]) 10/24/2020   Pre-diabetes 10/24/2020   BMI 37.0-37.9, adult 10/24/2020   Encounter for preventative adult health care exam with abnormal findings 10/24/2020   Elevated BP without diagnosis of hypertension 10/11/2020   Anxiety 04/21/2020   Wrist pain, left 04/21/2020   Knee pain, right 05/05/2015   Asthma     Past Surgical History:  Procedure Laterality Date   WISDOM TOOTH EXTRACTION         Home Medications    Prior to Admission medications   Medication Sig Start Date End Date Taking? Authorizing Provider  albuterol (VENTOLIN HFA) 108 (90 Base) MCG/ACT inhaler Inhale 1-2 puffs into the lungs every 4 (four) hours as needed for wheezing or shortness of breath. 09/02/21   Domenick Gong, MD  budesonide-formoterol [facility]) 160-4.5 MCG/ACT inhaler Inhale 2 puffs into the lungs 2 (two) times daily. 05/15/21   Tommie Sams, DO  ipratropium (ATROVENT) 0.06 %  nasal spray Place 2 sprays into both nostrils 4 (four) times daily as needed for rhinitis. 05/15/21   Tommie Sams, DO  Spacer/Aero-Holding Chambers (AEROCHAMBER PLUS) inhaler Use with inhaler 09/02/21   Domenick Gong, MD    Family History Family History  Problem Relation Age of Onset   Asthma Father    Hypertension Mother    Diabetes Maternal Grandmother    Hypertension Maternal Grandmother    Alcohol abuse Paternal Grandmother     Social History Social History   Tobacco Use   Smoking status: Never   Smokeless tobacco: Never  Vaping Use   Vaping Use: Never used  Substance Use Topics   Alcohol use: Yes    Alcohol/week: 0.0 standard drinks    Comment: rarely   Drug use: No     Allergies   Patient has no known allergies.   Review of Systems Review of Systems  Skin:  Positive for rash.       Pruritis   The rest is neg   Physical Exam Triage Vital Signs ED Triage Vitals [11/09/21 1001]  Enc Vitals Group     BP (!) 143/99     Pulse Rate 86     Resp 16     Temp 98.4 F (36.9 C)     Temp Source Oral     SpO2 96 %  Weight      Height      Head Circumference      Peak Flow      Pain Score 0     Pain Loc      Pain Edu?      Excl. in [place]?    No data found.  Updated Vital Signs BP (!) 143/99 (BP Location: Left Arm)   Pulse 86   Temp 98.4 F (36.9 C) (Oral)   Resp 16   SpO2 96%   Visual Acuity Right Eye Distance:   Left Eye Distance:   Bilateral Distance:    Right Eye Near:   Left Eye Near:    Bilateral Near:     Physical Exam Vitals and nursing note reviewed.  Constitutional:      General: He is not in acute distress.    Appearance: He is obese. He is not toxic-appearing.  HENT:     Right Ear: External ear normal.     Left Ear: External ear normal.  Eyes:     Conjunctiva/sclera: Conjunctivae normal.  Pulmonary:     Effort: Pulmonary effort is normal.  Musculoskeletal:        General: Normal range of motion.     Cervical back: Neck  supple.  Skin:    General: Skin is warm and dry.     Comments: Has multiple skin color pin head papules scattered on arms, back, abd. More confluent in inner thighs. I looked at the rash with magnifying lens and each papules is coming from the skin follicles  Neurological:     Mental Status: He is alert and oriented to person, place, and time.     Gait: Gait normal.  Psychiatric:        Mood and Affect: Mood normal.        Behavior: Behavior normal.        Thought Content: Thought content normal.        Judgment: Judgment normal.     UC Treatments / Results  Labs (all labs ordered are listed, but only abnormal results are displayed) Labs Reviewed - No data to display  EKG   Radiology No results found.  Procedures Procedures (including critical care time)  Medications Ordered in UC Medications - No data to display  Initial Impression / Assessment and Plan / UC Course  I have reviewed the triage vital signs and the nursing notes. Folliculitis I placed pt on Doxy as noted. See instructions.  Has apt with derm in January     Final Clinical Impressions(s) / UC Diagnoses   Final diagnoses:  None   Discharge Instructions   None    ED Prescriptions   None    PDMP not reviewed this encounter.   Shelby Mattocks, [place] 11/09/21 1033

## 2021-11-09 NOTE — ED Triage Notes (Signed)
Patient presents to Urgent Care with complaints of generalized body rash x 2 days ago. Treating with benadryl and aloe vera.   Denies fever, changes in diet, changes in medications, or changes in product.

## 2021-11-09 NOTE — Discharge Instructions (Signed)
Scrub your skin with a rough sponge over the bumps so the hair follicles are open Follow up with a dermatologist if the rash does not resolve

## 2021-11-15 ENCOUNTER — Encounter: Payer: Self-pay | Admitting: Adult Health

## 2021-11-15 ENCOUNTER — Other Ambulatory Visit: Payer: Self-pay

## 2021-11-15 ENCOUNTER — Ambulatory Visit: Payer: 59 | Admitting: Adult Health

## 2021-11-15 VITALS — BP 148/90 | HR 97 | Temp 95.8°F | Ht 65.98 in | Wt 231.2 lb

## 2021-11-15 DIAGNOSIS — R7303 Prediabetes: Secondary | ICD-10-CM | POA: Diagnosis not present

## 2021-11-15 DIAGNOSIS — J4520 Mild intermittent asthma, uncomplicated: Secondary | ICD-10-CM | POA: Diagnosis not present

## 2021-11-15 DIAGNOSIS — E559 Vitamin D deficiency, unspecified: Secondary | ICD-10-CM

## 2021-11-15 DIAGNOSIS — R030 Elevated blood-pressure reading, without diagnosis of hypertension: Secondary | ICD-10-CM

## 2021-11-15 DIAGNOSIS — Z6837 Body mass index (BMI) 37.0-37.9, adult: Secondary | ICD-10-CM | POA: Diagnosis not present

## 2021-11-15 DIAGNOSIS — Z1389 Encounter for screening for other disorder: Secondary | ICD-10-CM

## 2021-11-15 MED ORDER — AEROCHAMBER PLUS MISC: 2 refills | Status: AC

## 2021-11-15 MED ORDER — ALBUTEROL SULFATE HFA 108 (90 BASE) MCG/ACT IN AERS: 1.0000 | INHALATION_SPRAY | RESPIRATORY_TRACT | 0 refills | Status: DC | PRN

## 2021-11-15 MED ORDER — BUDESONIDE-FORMOTEROL FUMARATE 160-4.5 MCG/ACT IN AERO: 2.0000 | INHALATION_SPRAY | Freq: Two times a day (BID) | RESPIRATORY_TRACT | 12 refills | Status: DC

## 2021-11-15 NOTE — Progress Notes (Signed)
New Patient Office Visit  Subjective:  Patient ID: Brandon Marsh, male    DOB: Jul 25, 1986  Age: 35 y.o. MRN: [id number]  CC:  Chief Complaint  Patient presents with   Transitions Of Care    HPI ROOSVELT CHURCHWELL presents for new ine establishment of care. He reports white coat syndrome in office. Blood pressure is high today he has 148/90 at office today reports 120's over 80's at home.   Asthma history controlled on Symbicort and albuterol.  He wants possible weight loss medication. He knows he needs to loose weight.   Patients wife on [place] and has lost a lot of weight he is interested in this. He has had a history of prediabetes in the past. Denies any history of thyroid cancer or thyroid problems.  Needs refill on inhalers   Patient  denies any fever, body aches,chills, rash, chest pain, shortness of breath, nausea, vomiting, or diarrhea.  Denies dizziness, lightheadedness, pre syncopal or syncopal episodes.    Past Medical History:  Diagnosis Date   Alopecia    Anxiety and depression 04/21/2020   Asthma    Elevated blood pressure reading without diagnosis of hypertension    Wrist pain, left 04/21/2020    Past Surgical History:  Procedure Laterality Date   WISDOM TOOTH EXTRACTION      Family History  Problem Relation Age of Onset   Asthma Father    Hypertension Mother    Diabetes Maternal Grandmother    Hypertension Maternal Grandmother    Alcohol abuse Paternal Grandmother     Social History   Socioeconomic History   Marital status: Married    Spouse name: Not on file   Number of children: Not on file   Years of education: Not on file   Highest education level: Not on file  Occupational History   Occupation: Systems developer  Tobacco Use   Smoking status: Never   Smokeless tobacco: Never  Vaping Use   Vaping Use: Never used  Substance and Sexual Activity   Alcohol use: Yes    Alcohol/week: 0.0 standard drinks    Comment: rarely    Drug use: No   Sexual activity: Yes  Other Topics Concern   Not on file  Social History Narrative   Married, has 34 year old and 72 year old and mother in law with health issues   Social Determinants of Health   Financial Resource Strain: Not on file  Food Insecurity: Not on file  Transportation Needs: Not on file  Physical Activity: Not on file  Stress: Not on file  Social Connections: Not on file  Intimate Partner Violence: Not on file    ROS Review of Systems  Constitutional:  Positive for unexpected weight change (weight gain of 30- 40 lbs through pandemic and stress). Negative for activity change, appetite change, chills, diaphoresis, fatigue and fever.  HENT: Negative.    Respiratory: Negative.    Cardiovascular: Negative.   Gastrointestinal: Negative.   Genitourinary: Negative.   Musculoskeletal: Negative.   Neurological: Negative.   Hematological: Negative.   Psychiatric/Behavioral: Negative.     Objective:   Today's Vitals: BP (!) 148/90    Pulse 97    Temp (!) 95.8 F (35.4 C)    Ht 5' 5.98" (1.676 m)    Wt 231 lb 3.2 oz (104.9 kg)    SpO2 98%    BMI 37.33 kg/m   Physical Exam Vitals reviewed.  Constitutional:      General:  He is not in acute distress.    Appearance: He is obese. He is not ill-appearing, toxic-appearing or diaphoretic.  HENT:     Head: Normocephalic and atraumatic.     Right Ear: External ear normal.     Left Ear: External ear normal.     Nose: Nose normal.     Mouth/Throat:     Pharynx: Oropharynx is clear.  Eyes:     Conjunctiva/sclera: Conjunctivae normal.  Neck:     Vascular: No carotid bruit.  Cardiovascular:     Rate and Rhythm: Normal rate and regular rhythm.     Pulses: Normal pulses.     Heart sounds: Normal heart sounds. No murmur heard.   No friction rub. No gallop.  Pulmonary:     Effort: Pulmonary effort is normal. No respiratory distress.     Breath sounds: Normal breath sounds. No stridor. No wheezing, rhonchi or  rales.  Chest:     Chest wall: No tenderness.  Abdominal:     General: There is no distension.     Palpations: Abdomen is soft.  Musculoskeletal:        General: Normal range of motion.     Cervical back: Normal range of motion and neck supple. No rigidity or tenderness.  Lymphadenopathy:     Cervical: No cervical adenopathy.  Skin:    General: Skin is warm.     Findings: No erythema or rash.  Neurological:     Mental Status: He is oriented to person, place, and time.     Motor: No weakness.     Gait: Gait normal.  Psychiatric:        Mood and Affect: Mood normal.        Behavior: Behavior normal.        Thought Content: Thought content normal.        Judgment: Judgment normal.    Assessment & Plan:   Problem List Items Addressed This Visit       Respiratory   Asthma   Relevant Medications   albuterol (VENTOLIN HFA) 108 (90 Base) MCG/ACT inhaler   budesonide-formoterol (SYMBICORT) 160-4.5 MCG/ACT inhaler     Other   Elevated BP without diagnosis of hypertension   Pre-diabetes - Primary   Relevant Orders   CBC with Differential/Platelet   Comprehensive metabolic panel   TSH   Lipid panel   Hemoglobin A1c   BMI 37.0-37.9, adult   Other Visit Diagnoses     Vitamin D deficiency       Relevant Orders   VITAMIN D 25 Hydroxy (Vit-D Deficiency, Fractures)   Screening for blood or protein in urine       Relevant Orders   Urinalysis, Routine w reflex microscopic       Outpatient Encounter Medications as of 11/15/2021  Medication Sig   [DISCONTINUED] albuterol (VENTOLIN HFA) 108 (90 Base) MCG/ACT inhaler Inhale 1-2 puffs into the lungs every 4 (four) hours as needed for wheezing or shortness of breath.   [DISCONTINUED] budesonide-formoterol (SYMBICORT) 160-4.5 MCG/ACT inhaler Inhale 2 puffs into the lungs 2 (two) times daily.   [DISCONTINUED] Spacer/Aero-Holding Chambers (AEROCHAMBER PLUS) inhaler Use with inhaler   albuterol (VENTOLIN HFA) 108 (90 Base) MCG/ACT  inhaler Inhale 1-2 puffs into the lungs every 4 (four) hours as needed for wheezing or shortness of breath.   budesonide-formoterol (SYMBICORT) 160-4.5 MCG/ACT inhaler Inhale 2 puffs into the lungs 2 (two) times daily.   cyproheptadine (PERIACTIN) 4 MG tablet Take 1 tablet (4 mg total)  by mouth 3 (three) times daily as needed for allergies. Prn itching (Patient not taking: Reported on 11/15/2021)   ipratropium (ATROVENT) 0.06 % nasal spray Place 2 sprays into both nostrils 4 (four) times daily as needed for rhinitis. (Patient not taking: Reported on 11/15/2021)   Spacer/Aero-Holding Chambers (AEROCHAMBER PLUS) inhaler Use with inhaler   [DISCONTINUED] doxycycline (VIBRAMYCIN) 100 MG capsule Take 1 capsule (100 mg total) by mouth 2 (two) times daily. (Patient not taking: Reported on 11/15/2021)   No facility-administered encounter medications on file as of 11/15/2021.    Meds ordered this encounter  Medications   albuterol (VENTOLIN HFA) 108 (90 Base) MCG/ACT inhaler    Sig: Inhale 1-2 puffs into the lungs every 4 (four) hours as needed for wheezing or shortness of breath.    Dispense:  1 each    Refill:  0   budesonide-formoterol (SYMBICORT) 160-4.5 MCG/ACT inhaler    Sig: Inhale 2 puffs into the lungs 2 (two) times daily.    Dispense:  1 each    Refill:  12   Spacer/Aero-Holding Chambers (AEROCHAMBER PLUS) inhaler    Sig: Use with inhaler    Dispense:  1 each    Refill:  2    Please educate patient on use    Asthma well controlled per patient no recent exacerbations needs refill.  Consider [name] if diabetes to help manage and weight loss.   Elevated blood pressure reading today he reports he has whitecoat syndrome we will have him monitor monitor pressure at home and we will recheck at next office visit.  Red Flags discussed. The patient was given clear instructions to go to ER or return to medical center if any red flags develop, symptoms do not improve, worsen or new problems  develop. They verbalized understanding.  Return if symptoms worsen or fail to improve, for at any time for any worsening symptoms, Go to Emergency room/ urgent care if worse.   Follow-up: Return if symptoms worsen or fail to improve, for at any time for any worsening symptoms, Go to Emergency room/ urgent care if worse.   Marcille Buffy, FNP

## 2021-11-15 NOTE — Patient Instructions (Signed)

## 2021-11-28 ENCOUNTER — Other Ambulatory Visit (INDEPENDENT_AMBULATORY_CARE_PROVIDER_SITE_OTHER): Payer: 59

## 2021-11-28 ENCOUNTER — Other Ambulatory Visit: Payer: Self-pay

## 2021-11-28 DIAGNOSIS — R799 Abnormal finding of blood chemistry, unspecified: Secondary | ICD-10-CM | POA: Diagnosis not present

## 2021-11-28 DIAGNOSIS — R7303 Prediabetes: Secondary | ICD-10-CM

## 2021-11-28 DIAGNOSIS — Z1389 Encounter for screening for other disorder: Secondary | ICD-10-CM | POA: Diagnosis not present

## 2021-11-28 DIAGNOSIS — E559 Vitamin D deficiency, unspecified: Secondary | ICD-10-CM | POA: Diagnosis not present

## 2021-11-28 LAB — CBC WITH DIFFERENTIAL/PLATELET
Basophils Absolute: 0 10*3/uL (ref 0.0–0.1)
Basophils Relative: 0.5 % (ref 0.0–3.0)
Eosinophils Absolute: 0.4 10*3/uL (ref 0.0–0.7)
Eosinophils Relative: 4.1 % (ref 0.0–5.0)
HCT: 44.7 % (ref 39.0–52.0)
Hemoglobin: 15 g/dL (ref 13.0–17.0)
Lymphocytes Relative: 44.5 % (ref 12.0–46.0)
Lymphs Abs: 4.3 10*3/uL — ABNORMAL HIGH (ref 0.7–4.0)
MCHC: 33.6 g/dL (ref 30.0–36.0)
MCV: 82.8 fl (ref 78.0–100.0)
Monocytes Absolute: 0.7 10*3/uL (ref 0.1–1.0)
Monocytes Relative: 7 % (ref 3.0–12.0)
Neutro Abs: 4.2 10*3/uL (ref 1.4–7.7)
Neutrophils Relative %: 43.9 % (ref 43.0–77.0)
Platelets: 262 10*3/uL (ref 150.0–400.0)
RBC: 5.4 Mil/uL (ref 4.22–5.81)
RDW: 13.7 % (ref 11.5–15.5)
WBC: 9.6 10*3/uL (ref 4.0–10.5)

## 2021-11-28 LAB — URINALYSIS, ROUTINE W REFLEX MICROSCOPIC
Bilirubin Urine: NEGATIVE
Ketones, ur: NEGATIVE
Leukocytes,Ua: NEGATIVE
Nitrite: NEGATIVE
Specific Gravity, Urine: 1.025 (ref 1.000–1.030)
Total Protein, Urine: NEGATIVE
Urine Glucose: NEGATIVE
Urobilinogen, UA: 0.2 (ref 0.0–1.0)
WBC, UA: NONE SEEN (ref 0–?)
pH: 6.5 (ref 5.0–8.0)

## 2021-11-28 LAB — COMPREHENSIVE METABOLIC PANEL
ALT: 49 U/L (ref 0–53)
AST: 27 U/L (ref 0–37)
Albumin: 4.6 g/dL (ref 3.5–5.2)
Alkaline Phosphatase: 72 U/L (ref 39–117)
BUN: 10 mg/dL (ref 6–23)
CO2: 27 mEq/L (ref 19–32)
Calcium: 10 mg/dL (ref 8.4–10.5)
Chloride: 99 mEq/L (ref 96–112)
Creatinine, Ser: 0.98 mg/dL (ref 0.40–1.50)
GFR: 100.96 mL/min (ref >60.00)
Glucose, Bld: 145 mg/dL — ABNORMAL HIGH (ref 70–99)
Potassium: 4.5 mEq/L (ref 3.5–5.1)
Sodium: 136 mEq/L (ref 135–145)
Total Bilirubin: 0.7 mg/dL (ref 0.2–1.2)
Total Protein: 7.6 g/dL (ref 6.0–8.3)

## 2021-11-28 LAB — LIPID PANEL
Cholesterol: 246 mg/dL — ABNORMAL HIGH (ref 0–200)
HDL: 38.8 mg/dL — ABNORMAL LOW (ref >39.00)
Total CHOL/HDL Ratio: 6
Triglycerides: 411 mg/dL — ABNORMAL HIGH (ref 0.0–149.0)

## 2021-11-28 LAB — TSH: TSH: 1.29 u[IU]/mL (ref 0.35–5.50)

## 2021-11-28 LAB — HEMOGLOBIN A1C: Hgb A1c MFr Bld: 8.2 % — ABNORMAL HIGH (ref 4.6–6.5)

## 2021-11-28 LAB — LDL CHOLESTEROL, DIRECT: Direct LDL: 140 mg/dL

## 2021-11-28 LAB — VITAMIN D 25 HYDROXY (VIT D DEFICIENCY, FRACTURES): VITD: 23.03 ng/mL — ABNORMAL LOW (ref 30.00–100.00)

## 2021-11-29 ENCOUNTER — Other Ambulatory Visit: Payer: Self-pay | Admitting: Adult Health

## 2021-11-29 DIAGNOSIS — E559 Vitamin D deficiency, unspecified: Secondary | ICD-10-CM

## 2021-11-29 MED ORDER — VITAMIN D (ERGOCALCIFEROL) 1.25 MG (50000 UNIT) PO CAPS: 50000.0000 [IU] | ORAL_CAPSULE | ORAL | 0 refills | Status: DC

## 2021-11-29 NOTE — Progress Notes (Signed)
Meds ordered this encounter  Medications   Vitamin D, Ergocalciferol, (DRISDOL) 1.25 MG (50000 UNIT) CAPS capsule    Sig: Take 1 capsule (50,000 Units total) by mouth every 7 (seven) days. (taking one tablet per week) scheduled Vitamin D lab in  1-2 weeks after completing prescription.    Dispense:  12 capsule    Refill:  0

## 2021-11-29 NOTE — Progress Notes (Signed)
Tsh within normal for thyroid.

## 2021-11-29 NOTE — Progress Notes (Signed)
Lab shows patient is diabetic, will need follow up office visit to discuss glucose monitoring and medication/ diet changes. CBC  Direct LDL is within normal limits.  Trace blood in urine, please order a repeat urine microscopic and culture. Need to rule out no infection and no blood in urine.  Please schedule. Vitamin  D is low, this can contribute to poor sleep and fatigue, will send in prescription for Vitamin D at 50,000 units by mouth once every 7 days/(once weekly) for 12 weeks. Advise recheck lab Vitamin D in 1-2 weeks after completing vitamin d prescription. Labs need to be scheduled.   Total cholesterol and LDL and triglycerides  elevated.  Discuss lifestyle modification with patient e.g. increase exercise, fiber, fruits, vegetables, lean meat, and omega 3/fish intake and decrease saturated fat.  If patient following strict diet and exercise program already please schedule follow up appointment with primary care physician need to discuss cholesterol medication at next viist as cholesterol high. Triglycerides likely high due to uncontrolled blood sugars,, monitor bread, starches in diet and high sugar  foods and drinks. If he would like a referral to diabetes nutritionist ok to refer due to diabetes.

## 2021-11-30 ENCOUNTER — Other Ambulatory Visit: Payer: Self-pay

## 2021-11-30 DIAGNOSIS — R319 Hematuria, unspecified: Secondary | ICD-10-CM

## 2021-12-11 ENCOUNTER — Other Ambulatory Visit: Payer: Self-pay | Admitting: Adult Health

## 2021-12-11 ENCOUNTER — Telehealth: Payer: Self-pay | Admitting: Pharmacist

## 2021-12-11 ENCOUNTER — Ambulatory Visit: Payer: 59 | Admitting: Adult Health

## 2021-12-11 ENCOUNTER — Encounter: Payer: Self-pay | Admitting: Adult Health

## 2021-12-11 ENCOUNTER — Other Ambulatory Visit: Payer: Self-pay

## 2021-12-11 VITALS — BP 128/86 | HR 83 | Temp 96.4°F | Resp 18 | Ht 65.98 in | Wt 222.0 lb

## 2021-12-11 DIAGNOSIS — R350 Frequency of micturition: Secondary | ICD-10-CM

## 2021-12-11 DIAGNOSIS — E1165 Type 2 diabetes mellitus with hyperglycemia: Secondary | ICD-10-CM

## 2021-12-11 DIAGNOSIS — R030 Elevated blood-pressure reading, without diagnosis of hypertension: Secondary | ICD-10-CM

## 2021-12-11 DIAGNOSIS — Z6835 Body mass index (BMI) 35.0-35.9, adult: Secondary | ICD-10-CM

## 2021-12-11 LAB — URINALYSIS, MICROSCOPIC ONLY: RBC / HPF: NONE SEEN (ref 0–?)

## 2021-12-11 MED ORDER — TIRZEPATIDE 2.5 MG/0.5ML ~~LOC~~ SOAJ: 2.5000 mg | SUBCUTANEOUS | 0 refills | Status: DC

## 2021-12-11 MED ORDER — BLOOD GLUCOSE METER KIT: PACK | 0 refills | Status: AC

## 2021-12-11 NOTE — Progress Notes (Signed)
Urine microscopic within normal limits.

## 2021-12-11 NOTE — Assessment & Plan Note (Signed)
Urinary frequency for 2 days, could be related to diabetes, no rectal pain or pressure. Will check a urine microscopic and urine culture. Follow treatment plan from office  if not improving or any worsening within 72 hours and also return to office or open medical facility at ANYTIME if any symptoms persist, change, or worsen or you have any further concerns or questions. Call 911 immediately for emergencies.

## 2021-12-11 NOTE — Progress Notes (Signed)
Orders Placed This Encounter  °Procedures  ° AMB Referral to Community Care Coordinaton  °  Referral Priority:   Routine  °  Referral Type:   Consultation  °  Referral Reason:   Care Coordination  °  Number of Visits Requested:   1  °  °

## 2021-12-11 NOTE — Telephone Encounter (Signed)
Noted. He said his wife was on and was not even diabetic so I tried with same insurance.  We can do Metformin Trial initially - ok to place CCM for call ?

## 2021-12-11 NOTE — Assessment & Plan Note (Signed)
Will try to get [facility] approved to help with weight loss, diet and exercise. Offered nutritionist he politely declined.

## 2021-12-11 NOTE — Progress Notes (Addendum)
Acute Office Visit  Subjective:    Patient ID: Brandon Marsh, male    DOB: [birth year], 36 y.o.   MRN: [id number]  Chief Complaint  Patient presents with   Follow-up    Diabetes He presents for his initial diabetic visit. He has type 2 diabetes mellitus. No MedicAlert identification noted. Pertinent negatives for hypoglycemia include no confusion, dizziness, headaches, hunger, mood changes, nervousness/anxiousness, pallor, seizures, sleepiness, speech difficulty, sweats or tremors. Associated symptoms include polyuria. Pertinent negatives for diabetes include no blurred vision, no chest pain, no fatigue, no foot paresthesias, no foot ulcerations, no polydipsia and no polyphagia. Symptoms are worsening. Risk factors for coronary artery disease include diabetes mellitus. Current diabetic treatment includes diet. Compliance with diabetes treatment: new patient for diabetes is doing well with diet excercise and weight loss since diagnosis.  Denies any known hypoglycemia or hyperglycemia events has not been checking glucose here for meter today was prediabetic now diabetic.  Patient is in today for follow up on labs drawn 11/28/21 of 8.2. serum glucose fasting 145  Since he found out he went on a diet. Has not been on any medications for diabetes. Has intentionally lost 10 lbs since last visit.  He wants to try [facility]. His wife is on it and it is helping her well. No family history of thyroid cancers. No history of pancreatitis.   He has been having some urinary frequency in the past few days. Denies any rectal pain or pressure.   Patient  denies any fever, body aches,chills, rash, chest pain, shortness of breath, nausea, vomiting, or diarrhea.  Denies dizziness, lightheadedness, pre syncopal or syncopal episodes.     Past Medical History:  Diagnosis Date   Alopecia    Anxiety and depression 04/21/2020   Asthma    Elevated blood pressure reading without diagnosis of hypertension     Wrist pain, left 04/21/2020    Past Surgical History:  Procedure Laterality Date   WISDOM TOOTH EXTRACTION      Family History  Problem Relation Age of Onset   Asthma Father    Hypertension Mother    Diabetes Maternal Grandmother    Hypertension Maternal Grandmother    Alcohol abuse Paternal Grandmother     Social History   Socioeconomic History   Marital status: Married    Spouse name: Not on file   Number of children: Not on file   Years of education: Not on file   Highest education level: Not on file  Occupational History   Occupation: Doctor, hospital  Tobacco Use   Smoking status: Never   Smokeless tobacco: Never  Vaping Use   Vaping Use: Never used  Substance and Sexual Activity   Alcohol use: Yes    Alcohol/week: 0.0 standard drinks    Comment: rarely   Drug use: No   Sexual activity: Yes  Other Topics Concern   Not on file  Social History Narrative   Married, has 35 year old and 59 year old and mother in law with health issues   Social Determinants of Health   Financial Resource Strain: Not on file  Food Insecurity: Not on file  Transportation Needs: Not on file  Physical Activity: Not on file  Stress: Not on file  Social Connections: Not on file  Intimate Partner Violence: Not on file    Outpatient Medications Prior to Visit  Medication Sig Dispense Refill   albuterol (VENTOLIN HFA) 108 (90 Base) MCG/ACT inhaler Inhale 1-2 puffs into the lungs  every 4 (four) hours as needed for wheezing or shortness of breath. 1 each 0   budesonide-formoterol (SYMBICORT) 160-4.5 MCG/ACT inhaler Inhale 2 puffs into the lungs 2 (two) times daily. 1 each 12   Spacer/Aero-Holding Chambers (AEROCHAMBER PLUS) inhaler Use with inhaler 1 each 2   Vitamin D, Ergocalciferol, (DRISDOL) 1.25 MG (50000 UNIT) CAPS capsule Take 1 capsule (50,000 Units total) by mouth every 7 (seven) days. (taking one tablet per week) scheduled Vitamin D lab in  1-2 weeks after completing  prescription. 12 capsule 0   No facility-administered medications prior to visit.    No Known Allergies  Review of Systems  Constitutional: Negative.  Negative for fatigue.  HENT: Negative.    Eyes: Negative.  Negative for blurred vision.  Respiratory: Negative.    Cardiovascular: Negative.  Negative for chest pain.  Gastrointestinal: Negative.   Endocrine: Positive for polyuria. Negative for cold intolerance, heat intolerance, polydipsia and polyphagia.  Genitourinary:  Positive for frequency. Negative for decreased urine volume, difficulty urinating, dysuria, enuresis, flank pain, genital sores, hematuria, penile discharge, penile pain, penile swelling, scrotal swelling, testicular pain and urgency.  Musculoskeletal: Negative.   Skin:  Negative for pallor.  Neurological: Negative.  Negative for dizziness, tremors, seizures, speech difficulty and headaches.  Hematological: Negative.   Psychiatric/Behavioral:  Negative for confusion. The patient is not nervous/anxious.       Objective:    Physical Exam Vitals reviewed.  Constitutional:      Appearance: He is well-developed.  HENT:     Head: Normocephalic and atraumatic.     Right Ear: External ear normal.     Left Ear: External ear normal.  Neck:     Vascular: No carotid bruit.  Cardiovascular:     Rate and Rhythm: Normal rate and regular rhythm.     Heart sounds: Normal heart sounds.  Pulmonary:     Effort: Pulmonary effort is normal. No respiratory distress.     Breath sounds: Normal breath sounds. No wheezing, rhonchi or rales.  Musculoskeletal:     Cervical back: Normal range of motion.  Skin:    General: Skin is warm and dry.  Neurological:     Mental Status: He is alert.  Psychiatric:        Speech: Speech normal.        Behavior: Behavior normal.    BP 128/86    Pulse 83    Temp (!) 96.4 F (35.8 C)    Resp 18    Ht 5' 5.98" (1.676 m)    Wt 222 lb (100.7 kg)    SpO2 97%    BMI 35.85 kg/m  Wt Readings from  Last 3 Encounters:  12/11/21 222 lb (100.7 kg)  11/15/21 231 lb 3.2 oz (104.9 kg)  09/02/21 219 lb (99.3 kg)   Filed Weights   12/11/21 0806  Weight: 222 lb (100.7 kg)     Health Maintenance Due  Topic Date Due   FOOT EXAM  Never done   OPHTHALMOLOGY EXAM  Never done     There are no preventive care reminders to display for this patient.   Lab Results  Component Value Date   TSH 1.29 11/28/2021   Lab Results  Component Value Date   WBC 9.6 11/28/2021   HGB 15.0 11/28/2021   HCT 44.7 11/28/2021   MCV 82.8 11/28/2021   PLT 262.0 11/28/2021   Lab Results  Component Value Date   NA 136 11/28/2021   K 4.5 11/28/2021  CO2 27 11/28/2021   GLUCOSE 145 (H) 11/28/2021   BUN 10 11/28/2021   CREATININE 0.98 11/28/2021   BILITOT 0.7 11/28/2021   ALKPHOS 72 11/28/2021   AST 27 11/28/2021   ALT 49 11/28/2021   PROT 7.6 11/28/2021   ALBUMIN 4.6 11/28/2021   CALCIUM 10.0 11/28/2021   GFR 100.96 11/28/2021   Lab Results  Component Value Date   CHOL 246 (H) 11/28/2021   Lab Results  Component Value Date   HDL 38.80 (L) 11/28/2021   Lab Results  Component Value Date   LDLCALC 101 (H) 09/27/2015   Lab Results  Component Value Date   TRIG (H) 11/28/2021    411.0 Triglyceride is over 400; calculations on Lipids are invalid.   Lab Results  Component Value Date   CHOLHDL 6 11/28/2021   Lab Results  Component Value Date   HGBA1C 8.2 (H) 11/28/2021       Assessment & Plan:   Problem List Items Addressed This Visit       Endocrine   Uncontrolled type 2 diabetes mellitus with hyperglycemia ([place])    Glucose meter and monitoring discussed script given. [name] prescribed aware may need to try other diabetic agents before insurance will approve. Discussed hyperglycemia and hypoglycemia.  Diet and education .       Relevant Medications   blood glucose meter kit and supplies   tirzepatide [facility]) 2.5 MG/0.5ML Pen   Other Relevant Orders   Urine  Microalbumin w/creat. ratio   Hemoglobin A1c   Comprehensive metabolic panel     Other   Elevated BP without diagnosis of hypertension    B/P 128/86 on 12/11/20 improved.        Class 2 severe obesity due to excess calories with serious comorbidity and body mass index (BMI) of 35.0 to 35.9 in adult [facility])    Will try to get [facility] approved to help with weight loss, diet and exercise. Offered nutritionist he politely declined.       Relevant Medications   tirzepatide ([name]) 2.5 MG/0.5ML Pen   Urinary frequency - Primary    Urinary frequency for 2 days, could be related to diabetes, no rectal pain or pressure. Will check a urine microscopic and urine culture. Follow treatment plan from office  if not improving or any worsening within 72 hours and also return to office or open medical facility at ANYTIME if any symptoms persist, change, or worsen or you have any further concerns or questions. Call 911 immediately for emergencies.        Relevant Orders   Urine Microscopic Only   Urine Culture     Meds ordered this encounter  Medications   blood glucose meter kit and supplies    Sig: Dispense based on patient and insurance preference. Use up to four times daily as directed. (FOR ICD-10 E10.9, E11.9). check glucose fasting in am and TID before meals.    Dispense:  1 each    Refill:  0    Order Specific Question:   Number of strips    Answer:   180    Order Specific Question:   Number of lancets    Answer:   180   tirzepatide ([name]) 2.5 MG/0.5ML Pen    Sig: Inject 2.5 mg into the skin once a week.    Dispense:  2 mL    Refill:  0    Advise patient on how to inject.    Return in about 1 month (around 01/11/2022),  or if symptoms worsen or fail to improve, for at any time for any worsening symptoms, Go to Emergency room/ urgent care if worse.   Orders Placed This Encounter  Procedures   Urine Culture   Urine Microalbumin w/creat. ratio    Standing Status:   Future     Standing Expiration Date:   12/11/2022   Hemoglobin A1c    Standing Status:   Future    Standing Expiration Date:   12/11/2022   Comprehensive metabolic panel    Standing Status:   Future    Standing Expiration Date:   12/11/2022   Urine Microscopic Only   Urine micro and culture today.  Weight check in 1 month.  Other labs recheck in 3  months as above. Yearly eye exam at opthamology advised.  Also need foot exam at next visit with monofilament. AVS is given and reviewed.  Written prescription for meter and supplied for diabetes was given. Call if any problems with it. Advised patient call the office or your primary care doctor for an appointment if no improvement within 72 hours or if any symptoms change or worsen at any time  Advised ER or urgent Care if after hours or on weekend. Call 911 for emergency symptoms at any time.Patinet verbalized understanding of all instructions given/reviewed and treatment plan and has no further questions or concerns at this time.      Marcille Buffy, FNP

## 2021-12-11 NOTE — Patient Instructions (Addendum)
Urinary Frequency, Adult Urinary frequency means urinating more often than usual. You may urinate every 1-2 hours even though you drink a normal amount of fluid and do not have a bladder infection or condition. Although you urinate more often than normal, the total amount of urine produced in a day is normal. With urinary frequency, you may have an urgent need to urinate often. The stress and anxiety of needing to find a bathroom quickly can make this urge worse. This condition may go away on its own, or you may need treatment at home. Home treatment may include bladder training, exercises, taking medicines, or making changes to your diet. Follow these instructions at home: Bladder health Your health care provider will tell you what to do to improve bladder health. You may be told to: Keep a bladder diary. Keep track of: What you eat and drink. How often you urinate. How much you urinate. Follow a bladder training program. This may include: Learning to delay going to the bathroom. Double urinating, also called voiding. This helps if you are not completely emptying your bladder. Scheduled voiding. Do Kegel exercises. Kegel exercises strengthen the muscles that help control urination, which may help the condition.  Eating and drinking Follow instructions from your health care provider about eating or drinking restrictions. You may be told to: Avoid caffeine. Drink fewer fluids, especially alcohol. Avoid drinking in the evening. Avoid foods or drinks that may irritate the bladder. These include coffee, tea, soda, artificial sweeteners, citrus, tomato-based foods, and chocolate. Eat foods that help prevent or treat constipation. Constipation can make urinary frequency worse. You may need to take these actions to prevent or treat constipation: Drink enough fluid to keep your urine pale yellow. Take over-the-counter or prescription medicines. Eat foods that are high in fiber, such as beans, whole  grains, and fresh fruits and vegetables. Limit foods that are high in fat and processed sugars, such as fried or sweet foods. General instructions Take over-the-counter and prescription medicines only as told by your health care provider. Keep all follow-up visits. This is important. Contact a health care provider if: You start urinating more often. You feel pain or irritation when you urinate. You notice blood in your urine. Your urine looks cloudy. You develop a fever. You begin vomiting. Get help right away if: You are unable to urinate. Summary Urinary frequency means urinating more often than usual. With urinary frequency, you may urinate every 1-2 hours even though you drink a normal amount of fluid and do not have a bladder infection or other bladder condition. Your health care provider may recommend that you keep a bladder diary, follow a bladder training program, or make dietary changes. If told by your health care provider, do Kegel exercises to strengthen the muscles that help control urination. Take over-the-counter and prescription medicines only as told by your health care provider. Contact a health care provider if your symptoms do not improve or get worse. This information is not intended to replace advice given to you by your health care provider. Make sure you discuss any questions you have with your health care provider. Document Revised: 06/24/2020 Document Reviewed: 06/24/2020 Elsevier Patient Education  2022 [place] is this medication? TIRZEPATIDE (tir ZEP a tide) treats type 2 diabetes. It works by increasing insulin levels in your body, which decreases your blood sugar (glucose). Changes to diet and exercise are often combined with this medication. This medicine may be used for other purposes; ask your health  care provider or pharmacist if you have questions. COMMON BRAND NAME(S): [name] What should I tell my care team before I  take this medication? They need to know if you have any of these conditions: Endocrine tumors (MEN 2) or if someone in your family had these tumors Eye disease, vision problems Gallbladder disease History of pancreatitis Kidney disease Stomach or intestine problems Thyroid cancer or if someone in your family had thyroid cancer An unusual or allergic reaction to tirzepatide, other medications, foods, dyes, or preservatives Pregnant or trying to get pregnant Breast-feeding How should I use this medication? This medication is injected under the skin. You will be taught how to prepare and give it. It is given once every week (every 7 days). Keep taking it unless your health care provider tells you to stop. If you use this medication with insulin, you should inject this medication and the insulin separately. Do not mix them together. Do not give the injections right next to each other. Change (rotate) injection sites with each injection. This medication comes with INSTRUCTIONS FOR USE. Ask your pharmacist for directions on how to use this medication. Read the information carefully. Talk to your pharmacist or care team if you have questions. It is important that you put your used needles and syringes in a special sharps container. Do not put them in a trash can. If you do not have a sharps container, call your pharmacist or care team to get one. A special MedGuide will be given to you by the pharmacist with each prescription and refill. Be sure to read this information carefully each time. Talk to your care team about the use of this medication in children. Special care may be needed. Overdosage: If you think you have taken too much of this medicine contact a poison control center or emergency room at once. NOTE: This medicine is only for you. Do not share this medicine with others. What if I miss a dose? If you miss a dose, take it as soon as you can unless it is more than 4 days (96 hours) late. If  it is more than 4 days late, skip the missed dose. Take the next dose at the normal time. Do not take 2 doses within 3 days of each other. What may interact with this medication? Alcohol containing beverages Antiviral medications for HIV or AIDS Aspirin and aspirin-like medications Beta-blockers like atenolol, metoprolol, propranolol Certain medications for blood pressure, heart disease, irregular heart beat Chromium Clonidine Diuretics Male hormones, such as estrogens or progestins, birth control pills Fenofibrate Gemfibrozil Guanethidine Isoniazid Lanreotide Male hormones or anabolic steroids MAOIs like Carbex, Eldepryl, Marplan, Nardil, and Parnate Medications for weight loss Medications for allergies, asthma, cold, or cough Medications for depression, anxiety, or psychotic disturbances Niacin Nicotine NSAIDs, medications for pain and inflammation, like ibuprofen or naproxen Octreotide Other medications for diabetes, like glyburide, glipizide, or glimepiride Pasireotide Pentamidine Phenytoin Probenecid Quinolone antibiotics such as ciprofloxacin, levofloxacin, ofloxacin Reserpine Some herbal dietary supplements Steroid medications such as prednisone or cortisone Sulfamethoxazole; trimethoprim Thyroid hormones Warfarin This list may not describe all possible interactions. Give your health care provider a list of all the medicines, herbs, non-prescription drugs, or dietary supplements you use. Also tell them if you smoke, drink alcohol, or use illegal drugs. Some items may interact with your medicine. What should I watch for while using this medication? Visit your care team for regular checks on your progress. Drink plenty of fluids while taking this medication. Check with your care  team if you get an attack of severe diarrhea, nausea, and vomiting. The loss of too much body fluid can make it dangerous for you to take this medication. A test called the HbA1C (A1C) will be  monitored. This is a simple blood test. It measures your blood sugar control over the last 2 to 3 months. You will receive this test every 3 to 6 months. Learn how to check your blood sugar. Learn the symptoms of low and high blood sugar and how to manage them. Always carry a quick-source of sugar with you in case you have symptoms of low blood sugar. Examples include hard sugar candy or glucose tablets. Make sure others know that you can choke if you eat or drink when you develop serious symptoms of low blood sugar, such as seizures or unconsciousness. They must get medical help at once. Tell your care team if you have high blood sugar. You might need to change the dose of your medication. If you are sick or exercising more than usual, you might need to change the dose of your medication. Do not skip meals. Ask your care team if you should avoid alcohol. Many nonprescription cough and cold products contain sugar or alcohol. These can affect blood sugar. Pens should never be shared. Even if the needle is changed, sharing may result in passing of viruses like hepatitis or HIV. Wear a medical ID bracelet or chain, and carry a card that describes your disease and details of your medication and dosage times. Birth control may not work properly while you are taking this medication. If you take birth control pills by mouth, your care team may recommend another type of birth control for 4 weeks after you start this medication and for 4 weeks after each increase in your dose of this medication. Ask your care team which birth control methods you should use. What side effects may I notice from receiving this medication? Side effects that you should report to your care team as soon as possible: Allergic reactions--skin rash, itching, hives, swelling of the face, lips, tongue, or throat Change in vision Dehydration--increased thirst, dry mouth, feeling faint or lightheaded, headache, dark yellow or brown  urine Gallbladder problems--severe stomach pain, nausea, vomiting, fever Kidney injury--decrease in the amount of urine, swelling of the ankles, hands, or feet Pancreatitis--severe stomach pain that spreads to your back or gets worse after eating or when touched, fever, nausea, vomiting Thyroid cancer--new mass or lump in the neck, pain or trouble swallowing, trouble breathing, hoarseness Side effects that usually do not require medical attention (report these to your care team if they continue or are bothersome): Constipation Diarrhea Loss of Appetite Nausea Stomach pain Upset stomach Vomiting This list may not describe all possible side effects. Call your doctor for medical advice about side effects. You may report side effects to FDA at 1-800-FDA-1088. Where should I keep my medication? Keep out of the reach of children and pets. Refrigeration (preferred): Store unopened pens in a refrigerator between 2 and 8 degrees C (36 and 46 degrees F). Keep it in the original carton until you are ready to take it. Do not freeze or use if the medication has been frozen. Protect from light. Get rid of any unused medication after the expiration date on the label. Room Temperature: The pen may be stored at room temperature below 30 degrees C (86 degrees F) for up to a total of 21 days if needed. Protect from light. Avoid exposure to extreme heat.  If it is stored at room temperature, throw away any unused medication after 21 days or after it expires, whichever is first. The pen has glass parts. Handle it carefully. If you drop the pen on a hard surface, do not use it. Use a new pen for your injection. To get rid of medications that are no longer needed or have expired: Take the medication to a medication take-back program. Check with your pharmacy or law enforcement to find a location. If you cannot return the medication, ask your pharmacist or care team how to get rid of this medication safely. NOTE: This  sheet is a summary. It may not cover all possible information. If you have questions about this medicine, talk to your doctor, pharmacist, or health care provider.  2022 Elsevier/Gold Standard (2021-04-19 00:00:00) Blood Glucose Monitoring, Adult Monitoring your blood sugar (glucose) is an important part of managing your diabetes. Blood glucose monitoring involves checking your blood glucose as often as directed and keeping a log or record of your results over time. Checking your blood glucose regularly and keeping a blood glucose log can: Help you and your health care provider adjust your diabetes management plan as needed, including your medicines or insulin. Help you understand how food, exercise, illnesses, and medicines affect your blood glucose. Let you know what your blood glucose is at any time. You can quickly find out if you have low blood glucose (hypoglycemia) or high blood glucose (hyperglycemia). Your health care provider will set individualized treatment goals for you. Your goals will be based on your age, other medical conditions you have, and how you respond to diabetes treatment. Generally, the goal of treatment is to maintain the following blood glucose levels: Before meals (preprandial): 80-130 mg/dL (4.4-7.2 mmol/L). After meals (postprandial): below 180 mg/dL (10 mmol/L). A1C level: less than 7%. Supplies needed: Blood glucose meter. Test strips for your meter. Each meter has its own strips. You must use the strips that came with your meter. A needle to prick your finger (lancet). Do not use a lancet more than one time. A device that holds the lancet (lancing device). A journal or log book to write down your results. How to check your blood glucose Checking your blood glucose  Wash your hands for at least 20 seconds with soap and water. Prick the side of your finger (not the tip) with the lancet. Do not use the same finger consecutively. Gently rub the finger until a  small drop of blood appears. Follow instructions that come with your meter for inserting the test strip, applying blood to the strip, and using your blood glucose meter. Write down your result and any notes in your log. Using alternative sites Some meters allow you to use areas of your body other than your finger (alternative sites) to test your blood. The most common alternative sites are the forearm, the thigh, and the palm of your hand. Alternative sites may not be as accurate as the fingers because blood flow is slower in those areas. This means that the result you get may be delayed, and it may be different from the result that you would get from your finger. Use the finger only, and do not use alternative sites, if: You think you have hypoglycemia. You sometimes do not know that your blood glucose is getting low (hypoglycemia unawareness). General tips and recommendations Blood glucose log  Every time you check your blood glucose, write down your result. Also write down any notes about things that  may be affecting your blood glucose, such as your diet and exercise for the day. This information can help you and your health care provider: Look for patterns in your blood glucose over time. Adjust your diabetes management plan as needed. Check if your meter allows you to download your records to a computer or if there is an app for the meter. Most glucose meters store a record of glucose readings in the meter. If you have type 1 diabetes: Check your blood glucose 4 or more times a day if you are on intensive insulin therapy with multiple daily injections (MDI) or if you are using an insulin pump. Check your blood glucose: Before every meal and snack. Before bedtime. Also check your blood glucose: If you have symptoms of hypoglycemia. After treating low blood glucose. Before doing activities that create a risk for injury, like driving or using machinery. Before and after exercise. Two hours  after a meal. Occasionally between 2:00 a.m. and 3:00 a.m., as directed. You may need to check your blood glucose more often, 6-10 times per day, if: You have diabetes that is not well controlled. You are ill. You have a history of severe hypoglycemia. You have hypoglycemia unawareness. If you have type 2 diabetes: Check your blood glucose 2 or more times a day if you take insulin or other diabetes medicines. Check your blood glucose 4 or more times a day if you are on intensive insulin therapy. Occasionally, you may also need to check your glucose between 2:00 a.m. and 3:00 a.m., as directed. Also check your blood glucose: Before and after exercise. Before doing activities that create a risk for injury, like driving or using machinery. You may need to check your blood glucose more often if: Your medicine is being adjusted. Your diabetes is not well controlled. You are ill. General tips Make sure you always have your supplies with you. After you use a few boxes of test strips, adjust (calibrate) your blood glucose meter by following instructions that came with your meter. If you have questions or need help, all blood glucose meters have a 24-hour hotline phone number available that you can call. Also contact your health care provider with questions or concerns you may have. Where to find more information The American Diabetes Association: www.diabetes.org The Association of Diabetes Care & Education Specialists: www.diabeteseducator.org Contact a health care provider if: Your blood glucose is at or above 240 mg/dL (13.3 mmol/L) for 2 days in a row. You have been sick or have had a fever for 2 days or longer, and you are not getting better. You have any of the following problems for more than 6 hours: You cannot eat or drink. You have nausea or vomiting. You have diarrhea. Get help right away if: Your blood glucose is lower than 54 mg/dL (3 mmol/L). You become confused, or you have  trouble thinking clearly. You have difficulty breathing. You have moderate or large ketone levels in your urine. These symptoms may represent a serious problem that is an emergency. Do not wait to see if the symptoms will go away. Get medical help right away. Call your local emergency services (911 in the [place] Do not drive yourself to the hospital. Summary Monitoring your blood glucose is an important part of managing your diabetes. Blood glucose monitoring involves checking your blood glucose as often as directed and keeping a log or record of your results over time. Your health care provider will set individualized treatment goals for you. Your  goals will be based on your age, other medical conditions you have, and how you respond to diabetes treatment. Every time you check your blood glucose, write down your result. Also, write down any notes about things that may be affecting your blood glucose, such as your diet and exercise for the day. This information is not intended to replace advice given to you by your health care provider. Make sure you discuss any questions you have with your health care provider. Document Revised: 08/17/2020 Document Reviewed: 08/17/2020 Elsevier Patient Education  2022 [place]. Diabetes Mellitus and Standards of Medical Care Living with and managing diabetes (diabetes mellitus) can be complicated. Your diabetes treatment may be managed by a team of health care providers, including: A physician who specializes in diabetes (endocrinologist). You might also have visits with a nurse practitioner or physician assistant. Nurses. A registered dietitian. A certified diabetes care and education specialist. An exercise specialist. A pharmacist. An eye doctor. A foot specialist (podiatrist). A dental care provider. A primary care provider. A mental health care provider. How to manage your diabetes You can do many things to successfully manage your diabetes. Your  health care providers will follow guidelines to help you get the best quality of care. Here are general guidelines for your diabetes management plan. Your health care providers may give you more specific instructions. Physical exams When you are diagnosed with diabetes, and each year after that, your health care provider will ask about your medical and family history. You will have a physical exam, which may include: Measuring your height, weight, and body mass index (BMI). Checking your blood pressure. This will be done at every routine medical visit. Your target blood pressure may vary depending on your medical conditions, your age, and other factors. A thyroid exam. A skin exam. Screening for nerve damage (peripheral neuropathy). This may include checking the pulse in your legs and feet and the level of sensation in your hands and feet. A foot exam to inspect the structure and skin of your feet, including checking for cuts, bruises, redness, blisters, sores, or other problems. Screening for blood vessel (vascular) problems. This may include checking the pulse in your legs and feet and checking your temperature. Blood tests Depending on your treatment plan and your personal needs, you may have the following tests: Hemoglobin A1C (HbA1C). This test provides information about blood sugar (glucose) control over the previous 2-3 months. It is used to adjust your treatment plan, if needed. This test will be done: At least 2 times a year, if you are meeting your treatment goals. 4 times a year, if you are not meeting your treatment goals or if your goals have changed. Lipid testing, including total cholesterol, LDL and HDL cholesterol, and triglyceride levels. The goal for LDL is less than 100 mg/dL (5.5 mmol/L). If you are at high risk for complications, the goal is less than 70 mg/dL (3.9 mmol/L). The goal for HDL is 40 mg/dL (2.2 mmol/L) or higher for men, and 50 mg/dL (2.8 mmol/L) or higher for  women. An HDL cholesterol of 60 mg/dL (3.3 mmol/L) or higher gives some protection against heart disease. The goal for triglycerides is less than 150 mg/dL (8.3 mmol/L). Liver function tests. Kidney function tests. Thyroid function tests.  Dental and eye exams  Visit your dentist two times a year. If you have type 1 diabetes, your health care provider may recommend an eye exam within 5 years after you are diagnosed, and then once a year  after your first exam. For children with type 1 diabetes, the health care provider may recommend an eye exam when your child is age 70 or older and has had diabetes for 3-5 years. After the first exam, your child should get an eye exam once a year. If you have type 2 diabetes, your health care provider may recommend an eye exam as soon as you are diagnosed, and then every 1-2 years after your first exam. Immunizations A yearly flu (influenza) vaccine is recommended annually for everyone 6 months or older. This is especially important if you have diabetes. The pneumonia (pneumococcal) vaccine is recommended for everyone 2 years or older who has diabetes. If you are age 55 or older, you may get the pneumonia vaccine as a series of two separate shots. The hepatitis B vaccine is recommended for adults shortly after being diagnosed with diabetes. Adults and children with diabetes should receive all other vaccines according to age-specific recommendations from the Centers for Disease Control and Prevention (CDC). Mental and emotional health Screening for symptoms of eating disorders, anxiety, and depression is recommended at the time of diagnosis and after as needed. If your screening shows that you have symptoms, you may need more evaluation. You may work with a mental health care provider. Follow these instructions at home: Treatment plan You will monitor your blood glucose levels and may give yourself insulin. Your treatment plan will be reviewed at every medical  visit. You and your health care provider will discuss: How you are taking your medicines, including insulin. Any side effects you have. Your blood glucose level target goals. How often you monitor your blood glucose level. Lifestyle habits, such as activity level and tobacco, alcohol, and substance use. Education Your health care provider will assess how well you are monitoring your blood glucose levels and whether you are taking your insulin and medicines correctly. He or she may refer you to: A certified diabetes care and education specialist to manage your diabetes throughout your life, starting at diagnosis. A registered dietitian who can create and review your personal nutrition plan. An exercise specialist who can discuss your activity level and exercise plan. General instructions Take over-the-counter and prescription medicines only as told by your health care provider. Keep all follow-up visits. This is important. Where to find support There are many diabetes support networks, including: American Diabetes Association (ADA): diabetes.org Defeat Diabetes Foundation: defeatdiabetes.org Where to find more information American Diabetes Association (ADA): www.diabetes.org Association of Diabetes Care & Education Specialists (ADCES): diabeteseducator.org International Diabetes Federation (IDF): https://www.munoz-bell.org/ Summary Managing diabetes (diabetes mellitus) can be complicated. Your diabetes treatment may be managed by a team of health care providers. Your health care providers follow guidelines to help you get the best quality care. You should have physical exams, blood tests, blood pressure monitoring, immunizations, and screening tests regularly. Stay updated on how to manage your diabetes. Your health care providers may also give you more specific instructions based on your individual health. This information is not intended to replace advice given to you by your health care provider. Make sure  you discuss any questions you have with your health care provider. Document Revised: 05/26/2020 Document Reviewed: 05/26/2020 Elsevier Patient Education  [place]. Diabetes Mellitus and Nutrition, Adult When you have diabetes, or diabetes mellitus, it is very important to have healthy eating habits because your blood sugar (glucose) levels are greatly affected by what you eat and drink. Eating healthy foods in the right amounts, at about the same  times every day, can help you: Manage your blood glucose. Lower your risk of heart disease. Improve your blood pressure. Reach or maintain a healthy weight. What can affect my meal plan? Every person with diabetes is different, and each person has different needs for a meal plan. Your health care provider may recommend that you work with a dietitian to make a meal plan that is best for you. Your meal plan may vary depending on factors such as: The calories you need. The medicines you take. Your weight. Your blood glucose, blood pressure, and cholesterol levels. Your activity level. Other health conditions you have, such as heart or kidney disease. How do carbohydrates affect me? Carbohydrates, also called carbs, affect your blood glucose level more than any other type of food. Eating carbs raises the amount of glucose in your blood. It is important to know how many carbs you can safely have in each meal. This is different for every person. Your dietitian can help you calculate how many carbs you should have at each meal and for each snack. How does alcohol affect me? Alcohol can cause a decrease in blood glucose (hypoglycemia), especially if you use insulin or take certain diabetes medicines by mouth. Hypoglycemia can be a life-threatening condition. Symptoms of hypoglycemia, such as sleepiness, dizziness, and confusion, are similar to symptoms of having too much alcohol. Do not drink alcohol if: Your health care provider tells you not to  drink. You are pregnant, may be pregnant, or are planning to become pregnant. If you drink alcohol: Limit how much you have to: 0-1 drink a day for women. 0-2 drinks a day for men. Know how much alcohol is in your drink. In the [place] one drink equals one 12 oz bottle of beer (355 mL), one 5 oz glass of wine (148 mL), or one 1 oz glass of hard liquor (44 mL). Keep yourself hydrated with water, diet soda, or unsweetened iced tea. Keep in mind that regular soda, juice, and other mixers may contain a lot of sugar and must be counted as carbs. What are tips for following this plan? Reading food labels Start by checking the serving size on the Nutrition Facts label of packaged foods and drinks. The number of calories and the amount of carbs, fats, and other nutrients listed on the label are based on one serving of the item. Many items contain more than one serving per package. Check the total grams (g) of carbs in one serving. Check the number of grams of saturated fats and trans fats in one serving. Choose foods that have a low amount or none of these fats. Check the number of milligrams (mg) of salt (sodium) in one serving. Most people should limit total sodium intake to less than 2,300 mg per day. Always check the nutrition information of foods labeled as "low-fat" or "nonfat." These foods may be higher in added sugar or refined carbs and should be avoided. Talk to your dietitian to identify your daily goals for nutrients listed on the label. Shopping Avoid buying canned, pre-made, or processed foods. These foods tend to be high in fat, sodium, and added sugar. Shop around the outside edge of the grocery store. This is where you will most often find fresh fruits and vegetables, bulk grains, fresh meats, and fresh dairy products. Cooking Use low-heat cooking methods, such as baking, instead of high-heat cooking methods, such as deep frying. Cook using healthy oils, such as olive, canola, or  sunflower oil. Avoid cooking with  butter, cream, or high-fat meats. Meal planning Eat meals and snacks regularly, preferably at the same times every day. Avoid going long periods of time without eating. Eat foods that are high in fiber, such as fresh fruits, vegetables, beans, and whole grains. Eat 4-6 oz (112-168 g) of lean protein each day, such as lean meat, chicken, fish, eggs, or tofu. One ounce (oz) (28 g) of lean protein is equal to: 1 oz (28 g) of meat, chicken, or fish. 1 egg.  cup (62 g) of tofu. Eat some foods each day that contain healthy fats, such as avocado, nuts, seeds, and fish. What foods should I eat? Fruits Berries. Apples. Oranges. Peaches. Apricots. Plums. Grapes. Mangoes. Papayas. Pomegranates. Kiwi. Cherries. Vegetables Leafy greens, including lettuce, spinach, kale, chard, collard greens, mustard greens, and cabbage. Beets. Cauliflower. Broccoli. Carrots. Green beans. Tomatoes. Peppers. Onions. Cucumbers. Brussels sprouts. Grains Whole grains, such as whole-wheat or whole-grain bread, crackers, tortillas, cereal, and pasta. Unsweetened oatmeal. Quinoa. Brown or wild rice. Meats and other proteins Seafood. Poultry without skin. Lean cuts of poultry and beef. Tofu. Nuts. Seeds. Dairy Low-fat or fat-free dairy products such as milk, yogurt, and cheese. The items listed above may not be a complete list of foods and beverages you can eat and drink. Contact a dietitian for more information. What foods should I avoid? Fruits Fruits canned with syrup. Vegetables Canned vegetables. Frozen vegetables with butter or cream sauce. Grains Refined white flour and flour products such as bread, pasta, snack foods, and cereals. Avoid all processed foods. Meats and other proteins Fatty cuts of meat. Poultry with skin. Breaded or fried meats. Processed meat. Avoid saturated fats. Dairy Full-fat yogurt, cheese, or milk. Beverages Sweetened drinks, such as soda or iced tea. The  items listed above may not be a complete list of foods and beverages you should avoid. Contact a dietitian for more information. Questions to ask a health care provider Do I need to meet with a certified diabetes care and education specialist? Do I need to meet with a dietitian? What number can I call if I have questions? When are the best times to check my blood glucose? Where to find more information: American Diabetes Association: diabetes.org Academy of Nutrition and Dietetics: eatright.[employer] of Diabetes and Digestive and Kidney Diseases: AmenCredit.is Association of Diabetes Care & Education Specialists: diabeteseducator.org Summary It is important to have healthy eating habits because your blood sugar (glucose) levels are greatly affected by what you eat and drink. It is important to use alcohol carefully. A healthy meal plan will help you manage your blood glucose and lower your risk of heart disease. Your health care provider may recommend that you work with a dietitian to make a meal plan that is best for you. This information is not intended to replace advice given to you by your health care provider. Make sure you discuss any questions you have with your health care provider. Document Revised: 06/22/2020 Document Reviewed: 06/22/2020 Elsevier Patient Education  [place].

## 2021-12-11 NOTE — Telephone Encounter (Signed)
PA request for [facility] came through [facility] queue. Per the PA, [name] is non formulary. Formulary options include Ozempic, Rybelsus, Trulicity, or Victoza.   Anticipate insurance may require trial of metformin therapy before they will cover GLP1. Routing to PCP

## 2021-12-11 NOTE — Assessment & Plan Note (Signed)
Glucose meter and monitoring discussed script given. [name] prescribed aware may need to try other diabetic agents before insurance will approve. Discussed hyperglycemia and hypoglycemia.  Diet and education .

## 2021-12-11 NOTE — Assessment & Plan Note (Signed)
B/P 128/86 on 12/11/20 improved.

## 2021-12-12 ENCOUNTER — Telehealth: Payer: Self-pay

## 2021-12-12 LAB — URINE CULTURE
MICRO NUMBER:: 12844964
Result:: NO GROWTH
SPECIMEN QUALITY:: ADEQUATE

## 2021-12-12 NOTE — Chronic Care Management (AMB) (Signed)
°  Care Management   Outreach Note  12/12/2021 Name: Brandon Marsh MRN: [id number] DOB: [birth year]  Referred by: Doreen Beam, FNP Reason for referral : Care Coordination (Outreach to schedule referral with Pharm D )   An unsuccessful telephone outreach was attempted today. The patient was referred to the case management team for assistance with care management and care coordination.   Follow Up Plan:  A HIPAA compliant phone message was left for the patient providing contact information and requesting a return call.  The care management team will reach out to the patient again over the next 7 days.  If patient returns call to provider office, please advise to call [facility]  at [address], [place], [place], [place] [address] Direct Dial: [phone] [email or web address]: [email or web address]

## 2021-12-13 NOTE — Progress Notes (Signed)
No urine growth in culture.

## 2021-12-14 ENCOUNTER — Ambulatory Visit: Payer: 59 | Admitting: Adult Health

## 2021-12-15 ENCOUNTER — Telehealth: Payer: Self-pay | Admitting: Adult Health

## 2021-12-15 NOTE — Telephone Encounter (Signed)
Pt is calling in regards to medication issue he is having. Pt states the pharmacy is waiting on [facility] in order for pt to receive his medication.  tirzepatide [facility]) 2.5 MG/0.5ML Pen

## 2021-12-18 ENCOUNTER — Other Ambulatory Visit: Payer: Self-pay | Admitting: *Deleted

## 2021-12-18 DIAGNOSIS — R3915 Urgency of urination: Secondary | ICD-10-CM

## 2021-12-19 ENCOUNTER — Ambulatory Visit: Payer: 59 | Admitting: Adult Health

## 2021-12-19 ENCOUNTER — Other Ambulatory Visit
Admission: RE | Admit: 2021-12-19 | Discharge: 2021-12-19 | Disposition: A | Discharge status: Home / Self Care | Payer: 59 | Attending: Urology | Admitting: Urology

## 2021-12-19 ENCOUNTER — Other Ambulatory Visit: Payer: Self-pay

## 2021-12-19 ENCOUNTER — Encounter: Payer: Self-pay | Admitting: Urology

## 2021-12-19 ENCOUNTER — Ambulatory Visit: Payer: 59 | Admitting: Urology

## 2021-12-19 VITALS — BP 126/85 | HR 84 | Ht 65.0 in | Wt 218.0 lb

## 2021-12-19 DIAGNOSIS — R3915 Urgency of urination: Secondary | ICD-10-CM | POA: Diagnosis present

## 2021-12-19 LAB — URINALYSIS, COMPLETE (UACMP) WITH MICROSCOPIC
Bacteria, UA: NONE SEEN
Bilirubin Urine: NEGATIVE
Glucose, UA: NEGATIVE mg/dL
Ketones, ur: NEGATIVE mg/dL
Leukocytes,Ua: NEGATIVE
Nitrite: NEGATIVE
Protein, ur: NEGATIVE mg/dL
Specific Gravity, Urine: 1.025 (ref 1.005–1.030)
pH: 6 (ref 5.0–8.0)

## 2021-12-19 LAB — BLADDER SCAN AMB NON-IMAGING

## 2021-12-19 NOTE — Progress Notes (Signed)
° °  12/19/21 8:47 AM   Clementeen Hoof 1986/07/11 [id number]  CC: Urinary frequency, incontinence, dysuria  HPI: 36 year old male with type 2 diabetes(hemoglobin A1c 8.2) and anxiety/depression who is referred for a week of urinary frequency and incontinence/urinary dripping from 1/8-1/14.  He had some mild dysuria as well at that time.  This resolved spontaneously, and he is doing well now with really no urinary complaints.  He also had some mild lower abdominal pain at that time.  Urinalysis with PCP was completely benign, and urine culture was negative.  He denies any gross hematuria.  He was recently prescribed a new medication for diabetes, but has not yet started that medication.  He drinks only water during the day.  Urinalysis today is completely benign with 0-5 squamous cells, 0-5 WBCs, 0-5 RBCs, no bacteria, nitrite negative, no leukocytes, negative glucose.  PVR is normal at 11 mL.   PMH: Past Medical History:  Diagnosis Date   Alopecia    Anxiety and depression 04/21/2020   Asthma    Elevated blood pressure reading without diagnosis of hypertension    Wrist pain, left 04/21/2020    Surgical History: Past Surgical History:  Procedure Laterality Date   WISDOM TOOTH EXTRACTION       Family History: Family History  Problem Relation Age of Onset   Asthma Father    Hypertension Mother    Diabetes Maternal Grandmother    Hypertension Maternal Grandmother    Alcohol abuse Paternal Grandmother     Social History:  reports that he has never smoked. He has never used smokeless tobacco. He reports current alcohol use. He reports that he does not use drugs.  Physical Exam: BP 126/85    Pulse 84    Ht 5\' 5"  (1.651 m)    Wt 218 lb (98.9 kg)    BMI 36.28 kg/m    Constitutional:  Alert and oriented, No acute distress. Cardiovascular: No clubbing, cyanosis, or edema. Respiratory: Normal respiratory effort, no increased work of breathing. GI: Abdomen is soft, nontender,  nondistended, no abdominal masses GU: Uncircumcised phallus with patent meatus, no lesions, testicles 20 cc and descended bilaterally, nontender  Laboratory Data: Reviewed, see HPI  Pertinent Imaging: None to review  Assessment & Plan:   36 year old male with diabetes and anxiety who reports 1 week of urinary frequency and some mild incontinence from 1/8-1/14 that has resolved spontaneously.  Urinalysis and urine culture benign, PVR normal and emptying completely.  Reassurance provided, recommended improve diabetes control which can contribute to urinary frequency and avoiding bladder irritants.  Possible etiologies include a viral cystitis versus anxiety related urinary symptoms.  With improved symptoms and benign urinalysis and PVR, urethral stricture less likely.  We discussed cystoscopy as possible option in the future for worsening symptoms, return precautions discussed.  Follow-up with urology as needed   Nickolas Madrid, MD 12/19/2021  [facility] [address] [place], [place] [address] [phone]

## 2021-12-22 NOTE — Progress Notes (Signed)
See telephone note.

## 2021-12-22 NOTE — Telephone Encounter (Signed)
Placed call to pt to let him know Brandon Marsh will not be covered without him trying alternatives. Pt was driving at the time and could not write number down to contact and schedule with Catie. Pt requested it be sent through mychart.

## 2021-12-25 NOTE — Chronic Care Management (AMB) (Signed)
°  Care Management   Note  12/25/2021 Name: Brandon Marsh MRN: [id number] DOB: 1986-03-08  Brandon Marsh is a 36 y.o. year old male who is a primary care patient of Flinchum, Kelby Aline, FNP. I reached out to Brandon Marsh by phone today in response to a referral sent by [name]'s primary care provider.   [name] was given information about care management services today including:  Care management services include personalized support from designated clinical staff supervised by his physician, including individualized plan of care and coordination with other care providers 24/7 contact phone numbers for assistance for urgent and routine care needs. The patient may stop care management services at any time by phone call to the office staff.  Patient agreed to services and verbal consent obtained.   Follow up plan: Face to Face appointment with care management team member scheduled for: 12/28/2021  Noreene Larsson, [address], [place], [place] [address] Direct Dial: [phone] [email or web address]: [email or web address]

## 2021-12-28 ENCOUNTER — Ambulatory Visit: Payer: 59 | Admitting: Pharmacist

## 2021-12-28 ENCOUNTER — Other Ambulatory Visit: Payer: Self-pay

## 2021-12-28 DIAGNOSIS — E1165 Type 2 diabetes mellitus with hyperglycemia: Secondary | ICD-10-CM | POA: Diagnosis not present

## 2021-12-28 MED ORDER — METFORMIN HCL ER 500 MG PO TB24: 500.0000 mg | ORAL_TABLET | Freq: Two times a day (BID) | ORAL | 1 refills | Status: DC

## 2021-12-28 NOTE — Patient Instructions (Addendum)
Brandon Marsh,   Keep up the great work!  Our goal A1c is less than 6.5-7%. This corresponds with fasting sugars less than 110-130 and 2 hour after meal sugars less than 140-180. Please check your blood sugar several times a week, alternating between these two times.   We try to keep our blood pressure less than 130/80  We recommend that all patients with diabetes see an eye doctor yearly for a diabetic eye exam. Please schedule this and have the results sent to our office.   Call me if you have questions or concerns moving forward!  Catie Darnelle Maffucci, PharmD

## 2021-12-28 NOTE — Progress Notes (Signed)
Chief Complaint  Patient presents with   Diabetes    Brandon Marsh is a 36 y.o. year old male who was referred for medication management by their primary care provider, Flinchum, Kelby Aline, FNP. They presented for a face to face visit today.     Subjective: Diabetes:  Current medications: none; patient previously requested a prescription for [name], but insurance did not cover as he had not been on metformin  Medications tried in the past: none  Current glucose readings: fasting <100  Current meal patterns:  - Breakfast: avocados, egg whites, plant based protein patty - Lunch grilled chicken, broccoli, protein shake - Supper: chicken, broccoli, sweet potato - Drinks: water  Current physical activity: working out twice daily for ~ 45 minutes at each time  Current medication access support: none needed   Objective: Lab Results  Component Value Date   HGBA1C 8.2 (H) 11/28/2021    Lab Results  Component Value Date   CREATININE 0.98 11/28/2021   BUN 10 11/28/2021   NA 136 11/28/2021   K 4.5 11/28/2021   CL 99 11/28/2021   CO2 27 11/28/2021    Lab Results  Component Value Date   CHOL 246 (H) 11/28/2021   HDL 38.80 (L) 11/28/2021   LDLCALC 101 (H) 09/27/2015   LDLDIRECT 140.0 11/28/2021   TRIG (H) 11/28/2021    411.0 Triglyceride is over 400; calculations on Lipids are invalid.   CHOLHDL 6 11/28/2021    Medications Reviewed Today     Reviewed by De Hollingshead, RPH-CPP (Pharmacist) on 12/28/21 at 1138  Med List Status: <None>   Medication Order Taking? Sig Documenting Provider Last Dose Status Informant  albuterol (VENTOLIN HFA) 108 (90 Base) MCG/ACT inhaler [id number] Yes Inhale 1-2 puffs into the lungs every 4 (four) hours as needed for wheezing or shortness of breath. Flinchum, Kelby Aline, FNP Taking Active   blood glucose meter kit and supplies [id number] Yes Dispense based on patient and insurance preference. Use up to four times daily as  directed. (FOR ICD-10 E10.9, E11.9). check glucose fasting in am and TID before meals. Flinchum, Kelby Aline, FNP Taking Active   budesonide-formoterol [facility]) 160-4.5 MCG/ACT inhaler [id number] Yes Inhale 2 puffs into the lungs 2 (two) times daily. Flinchum, Kelby Aline, FNP Taking Active   Spacer/Aero-Holding Chambers (AEROCHAMBER PLUS) inhaler [id number] Yes Use with inhaler Flinchum, Kelby Aline, FNP Taking Active   Vitamin D, Ergocalciferol, (DRISDOL) 1.25 MG (50000 UNIT) CAPS capsule [id number] Yes Take 1 capsule (50,000 Units total) by mouth every 7 (seven) days. (taking one tablet per week) scheduled Vitamin D lab in  1-2 weeks after completing prescription. Flinchum, Kelby Aline, FNP Taking Active             Assessment/Plan:   Diabetes: - Currently uncontrolled but improved - Reviewed long term cardiovascular and renal outcomes of uncontrolled blood sugar - Reviewed goal A1c, goal fasting, and goal 2 hour post prandial glucose - Reviewed dietary modifications including focus on lean proteins, fruits and vegetables - Discussed addition of metformin vs continuation of dietary and lifestyle modification. He elects to start metformin. Discussed with PCP, in agreement. Start metformin XR 500 mg twice daily. Advised to start with daily administration for a week, then increase to twice daily to reduce risk of intolerance.  - Reviewed need for yearly diabetic eye exam.  - Reviewed cholesterol and BP goals - Recommend to check glucose several times weekly, alternating between fasting and post prandial  Follow Up Plan: declines need for Pharmacy follow up at this time, if next A1c elevated consider follow up with me  Catie Darnelle Maffucci, PharmD, [place], [place] Clinical Pharmacist [employer] at [facility] [phone]

## 2022-01-16 ENCOUNTER — Ambulatory Visit: Payer: 59 | Admitting: Adult Health

## 2022-01-23 ENCOUNTER — Ambulatory Visit: Payer: 59 | Admitting: Adult Health

## 2022-02-01 ENCOUNTER — Ambulatory Visit: Payer: 59 | Admitting: Adult Health

## 2022-02-06 ENCOUNTER — Encounter: Payer: Self-pay | Admitting: Adult Health

## 2022-02-06 ENCOUNTER — Telehealth: Payer: Self-pay | Admitting: Adult Health

## 2022-02-06 ENCOUNTER — Telehealth: Payer: Self-pay

## 2022-02-06 ENCOUNTER — Other Ambulatory Visit: Payer: Self-pay

## 2022-02-06 ENCOUNTER — Ambulatory Visit: Payer: 59 | Admitting: Adult Health

## 2022-02-06 ENCOUNTER — Ambulatory Visit (INDEPENDENT_AMBULATORY_CARE_PROVIDER_SITE_OTHER): Payer: 59

## 2022-02-06 VITALS — BP 128/70 | HR 81 | Temp 98.3°F | Ht 65.0 in | Wt 206.0 lb

## 2022-02-06 DIAGNOSIS — E559 Vitamin D deficiency, unspecified: Secondary | ICD-10-CM | POA: Diagnosis not present

## 2022-02-06 DIAGNOSIS — E1165 Type 2 diabetes mellitus with hyperglycemia: Secondary | ICD-10-CM | POA: Diagnosis not present

## 2022-02-06 DIAGNOSIS — M79641 Pain in right hand: Secondary | ICD-10-CM

## 2022-02-06 DIAGNOSIS — R350 Frequency of micturition: Secondary | ICD-10-CM | POA: Diagnosis not present

## 2022-02-06 LAB — MICROALBUMIN / CREATININE URINE RATIO
Creatinine,U: 168.2 mg/dL
Microalb Creat Ratio: 0.4 mg/g (ref 0.0–30.0)
Microalb, Ur: <0.7 mg/dL (ref 0.0–1.9)

## 2022-02-06 LAB — CBC WITH DIFFERENTIAL/PLATELET
Basophils Absolute: 0 10*3/uL (ref 0.0–0.1)
Basophils Relative: 0.5 % (ref 0.0–3.0)
Eosinophils Absolute: 0.2 10*3/uL (ref 0.0–0.7)
Eosinophils Relative: 2.5 % (ref 0.0–5.0)
HCT: 43.5 % (ref 39.0–52.0)
Hemoglobin: 14.4 g/dL (ref 13.0–17.0)
Lymphocytes Relative: 47.8 % — ABNORMAL HIGH (ref 12.0–46.0)
Lymphs Abs: 2.9 10*3/uL (ref 0.7–4.0)
MCHC: 33.1 g/dL (ref 30.0–36.0)
MCV: 84.3 fl (ref 78.0–100.0)
Monocytes Absolute: 0.5 10*3/uL (ref 0.1–1.0)
Monocytes Relative: 8 % (ref 3.0–12.0)
Neutro Abs: 2.5 10*3/uL (ref 1.4–7.7)
Neutrophils Relative %: 41.2 % — ABNORMAL LOW (ref 43.0–77.0)
Platelets: 268 10*3/uL (ref 150.0–400.0)
RBC: 5.16 Mil/uL (ref 4.22–5.81)
RDW: 13.9 % (ref 11.5–15.5)
WBC: 6.1 10*3/uL (ref 4.0–10.5)

## 2022-02-06 LAB — URINALYSIS, ROUTINE W REFLEX MICROSCOPIC
Bilirubin Urine: NEGATIVE
Hgb urine dipstick: NEGATIVE
Ketones, ur: NEGATIVE
Leukocytes,Ua: NEGATIVE
Nitrite: NEGATIVE
Specific Gravity, Urine: 1.02 (ref 1.000–1.030)
Total Protein, Urine: NEGATIVE
Urine Glucose: NEGATIVE
Urobilinogen, UA: 0.2 (ref 0.0–1.0)
pH: 6 (ref 5.0–8.0)

## 2022-02-06 LAB — COMPREHENSIVE METABOLIC PANEL
ALT: 24 U/L (ref 0–53)
AST: 25 U/L (ref 0–37)
Albumin: 4.7 g/dL (ref 3.5–5.2)
Alkaline Phosphatase: 71 U/L (ref 39–117)
BUN: 14 mg/dL (ref 6–23)
CO2: 29 mEq/L (ref 19–32)
Calcium: 9.6 mg/dL (ref 8.4–10.5)
Chloride: 101 mEq/L (ref 96–112)
Creatinine, Ser: 0.94 mg/dL (ref 0.40–1.50)
GFR: 105.99 mL/min (ref >60.00)
Glucose, Bld: 84 mg/dL (ref 70–99)
Potassium: 4.1 mEq/L (ref 3.5–5.1)
Sodium: 137 mEq/L (ref 135–145)
Total Bilirubin: 0.6 mg/dL (ref 0.2–1.2)
Total Protein: 7.3 g/dL (ref 6.0–8.3)

## 2022-02-06 LAB — HEMOGLOBIN A1C: Hgb A1c MFr Bld: 6.5 % (ref 4.6–6.5)

## 2022-02-06 LAB — VITAMIN D 25 HYDROXY (VIT D DEFICIENCY, FRACTURES): VITD: 49.04 ng/mL (ref 30.00–100.00)

## 2022-02-06 LAB — PSA: PSA: 0.39 ng/mL (ref 0.10–4.00)

## 2022-02-06 NOTE — Progress Notes (Signed)
Acute Office Visit  Subjective:    Patient ID: Brandon Marsh, male    DOB: [birth year], 36 y.o.   MRN: [id number]  Chief Complaint  Patient presents with   Follow-up    F/u - Diabetes Mellitus, Urinary Frequency. Pt reports has lost significant weight , weighing 206lb today. Reports blood sugar in 90's to low 100's. Reports still having urinary frequency but no other urinary symptoms. Pt also c/o R wrist pain. Stated fell in January. Has had pain since.    HPI Patient is in today for  follow up on diabetes, he has stopped juices, and junk food. He has intentionally lost weight, eating mostly broccoli, chicken and sweet potatoes. He is trying to reach goal weight of 170 lbs. Weights listed below.   He has seen urologist for urinary frequency, and worked up with Dr. Diamantina Providence and was seen last 12/19/2021. Bladder scan was performed. Denies any back pain, hematuria, or flank pain. Denies any hesitancy.   His blood sugars have been low  90- to high of 104. He denies any hypoglycemia episodes.  He is feeling better.   Patient  denies any fever, body aches,chills, rash, chest pain, shortness of breath, nausea, vomiting, or diarrhea.  Denies dizziness, lightheadedness, pre syncopal or syncopal episodes.   Past Medical History:  Diagnosis Date   Alopecia    Anxiety and depression 04/21/2020   Asthma    Elevated blood pressure reading without diagnosis of hypertension    Wrist pain, left 04/21/2020    Past Surgical History:  Procedure Laterality Date   WISDOM TOOTH EXTRACTION      Family History  Problem Relation Age of Onset   Asthma Father    Hypertension Mother    Diabetes Maternal Grandmother    Hypertension Maternal Grandmother    Alcohol abuse Paternal Grandmother     Social History   Socioeconomic History   Marital status: Married    Spouse name: Not on file   Number of children: Not on file   Years of education: Not on file   Highest education level: Not on file   Occupational History   Occupation: Doctor, hospital  Tobacco Use   Smoking status: Never   Smokeless tobacco: Never  Vaping Use   Vaping Use: Never used  Substance and Sexual Activity   Alcohol use: Yes    Alcohol/week: 0.0 standard drinks    Comment: rarely   Drug use: No   Sexual activity: Yes  Other Topics Concern   Not on file  Social History Narrative   Married, has 35 year old and 100 year old and mother in law with health issues   Social Determinants of Health   Financial Resource Strain: Not on file  Food Insecurity: Not on file  Transportation Needs: Not on file  Physical Activity: Not on file  Stress: Not on file  Social Connections: Not on file  Intimate Partner Violence: Not on file    Outpatient Medications Prior to Visit  Medication Sig Dispense Refill   albuterol (VENTOLIN HFA) 108 (90 Base) MCG/ACT inhaler Inhale 1-2 puffs into the lungs every 4 (four) hours as needed for wheezing or shortness of breath. 1 each 0   blood glucose meter kit and supplies Dispense based on patient and insurance preference. Use up to four times daily as directed. (FOR ICD-10 E10.9, E11.9). check glucose fasting in am and TID before meals. 1 each 0   budesonide-formoterol (SYMBICORT) 160-4.5 MCG/ACT inhaler Inhale 2 puffs  into the lungs 2 (two) times daily. 1 each 12   metFORMIN (GLUCOPHAGE XR) 500 MG 24 hr tablet Take 1 tablet (500 mg total) by mouth 2 (two) times daily. 180 tablet 1   Spacer/Aero-Holding Chambers (AEROCHAMBER PLUS) inhaler Use with inhaler 1 each 2   Vitamin D, Ergocalciferol, (DRISDOL) 1.25 MG (50000 UNIT) CAPS capsule Take 1 capsule (50,000 Units total) by mouth every 7 (seven) days. (taking one tablet per week) scheduled Vitamin D lab in  1-2 weeks after completing prescription. 12 capsule 0   No facility-administered medications prior to visit.    No Known Allergies  Review of Systems  Constitutional: Negative.   HENT: Negative.    Respiratory:  Negative.    Cardiovascular: Negative.   Genitourinary:  Positive for urgency. Negative for decreased urine volume, difficulty urinating, dysuria, enuresis, flank pain, frequency, genital sores, hematuria, penile discharge, penile pain, penile swelling, scrotal swelling and testicular pain.      Objective:    Physical Exam Vitals and nursing note reviewed.  Constitutional:      General: He is not in acute distress.    Appearance: Normal appearance. He is well-developed. He is not ill-appearing, toxic-appearing or diaphoretic.     Comments: Patient is alert and oriented and responsive to questions Engages in eye contact with provider. Speaks in full sentences without any pauses without any shortness of breath or distress.    HENT:     Head: Normocephalic and atraumatic.     Right Ear: Hearing and external ear normal.     Left Ear: Hearing and external ear normal.     Nose: Nose normal.     Mouth/Throat:     Mouth: Mucous membranes are moist.     Pharynx: Uvula midline. No oropharyngeal exudate.  Eyes:     General: Lids are normal. No scleral icterus.       Right eye: No discharge.        Left eye: No discharge.     Conjunctiva/sclera: Conjunctivae normal.     Pupils: Pupils are equal, round, and reactive to light.  Neck:     Thyroid: No thyromegaly.     Vascular: Normal carotid pulses. No carotid bruit, hepatojugular reflux or JVD.     Trachea: Trachea and phonation normal. No tracheal tenderness or tracheal deviation.     Meningeal: Brudzinski's sign absent.  Cardiovascular:     Rate and Rhythm: Normal rate and regular rhythm.     Pulses: Normal pulses.     Heart sounds: Normal heart sounds, S1 normal and S2 normal. Heart sounds not distant. No murmur heard.   No friction rub. No gallop.  Pulmonary:     Effort: Pulmonary effort is normal. No accessory muscle usage or respiratory distress.     Breath sounds: Normal breath sounds. No stridor. No wheezing, rhonchi or rales.   Chest:     Chest wall: No tenderness.  Abdominal:     General: Bowel sounds are normal. There is no distension.     Palpations: Abdomen is soft. There is no mass.     Tenderness: There is no abdominal tenderness. There is no right CVA tenderness, left CVA tenderness, guarding or rebound.     Hernia: No hernia is present.  Musculoskeletal:        General: No tenderness or deformity. Normal range of motion.     Cervical back: Full passive range of motion without pain, normal range of motion and neck supple.  Right lower leg: No edema.     Left lower leg: No edema.     Comments: Patient moves on and off of exam table and in room without difficulty. Gait is normal in hall and in room. Patient is oriented to person place time and situation. Patient answers questions appropriately and engages in conversation.   Lymphadenopathy:     Head:     Right side of head: No submental, submandibular, tonsillar, preauricular, posterior auricular or occipital adenopathy.     Left side of head: No submental, submandibular, tonsillar, preauricular, posterior auricular or occipital adenopathy.     Cervical: No cervical adenopathy.  Skin:    General: Skin is warm and dry.     Capillary Refill: Capillary refill takes less than 2 seconds.     Coloration: Skin is not pale.     Findings: No erythema or rash.     Nails: There is no clubbing.  Neurological:     Mental Status: He is alert and oriented to person, place, and time.     GCS: GCS eye subscore is 4. GCS verbal subscore is 5. GCS motor subscore is 6.     Cranial Nerves: No cranial nerve deficit.     Sensory: No sensory deficit.     Motor: No weakness or abnormal muscle tone.     Coordination: Coordination normal.     Gait: Gait normal.     Deep Tendon Reflexes: Reflexes are normal and symmetric. Reflexes normal.  Psychiatric:        Mood and Affect: Mood normal.        Speech: Speech normal.        Behavior: Behavior normal.        Thought  Content: Thought content normal.        Judgment: Judgment normal.    BP 128/70 (BP Location: Left Arm, Patient Position: Sitting, Cuff Size: Large)    Pulse 81    Temp 98.3 F (36.8 C) (Oral)    Ht _0  (1.651 m)    Wt 206 lb (93.4 kg)    SpO2 97%    BMI 34.28 kg/m  Wt Readings from Last 3 Encounters:  02/06/22 206 lb (93.4 kg)  12/19/21 218 lb (98.9 kg)  12/11/21 222 lb (100.7 kg)    Health Maintenance Due  Topic Date Due   OPHTHALMOLOGY EXAM  Never done   COVID-19 Vaccine (3 - Booster for Pfizer series) 04/28/2020   URINE MICROALBUMIN  10/24/2021    There are no preventive care reminders to display for this patient.   Lab Results  Component Value Date   TSH 1.29 11/28/2021   Lab Results  Component Value Date   WBC 9.6 11/28/2021   HGB 15.0 11/28/2021   HCT 44.7 11/28/2021   MCV 82.8 11/28/2021   PLT 262.0 11/28/2021   Lab Results  Component Value Date   NA 136 11/28/2021   K 4.5 11/28/2021   CO2 27 11/28/2021   GLUCOSE 145 (H) 11/28/2021   BUN 10 11/28/2021   CREATININE 0.98 11/28/2021   BILITOT 0.7 11/28/2021   ALKPHOS 72 11/28/2021   AST 27 11/28/2021   ALT 49 11/28/2021   PROT 7.6 11/28/2021   ALBUMIN 4.6 11/28/2021   CALCIUM 10.0 11/28/2021   GFR 100.96 11/28/2021   Lab Results  Component Value Date   CHOL 246 (H) 11/28/2021   Lab Results  Component Value Date   HDL 38.80 (L) 11/28/2021   Lab Results  Component  Value Date   LDLCALC 101 (H) 09/27/2015   Lab Results  Component Value Date   TRIG (H) 11/28/2021    411.0 Triglyceride is over 400; calculations on Lipids are invalid.   Lab Results  Component Value Date   CHOLHDL 6 11/28/2021   Lab Results  Component Value Date   HGBA1C 8.2 (H) 11/28/2021       Assessment & Plan:   Problem List Items Addressed This Visit       Endocrine   Uncontrolled type 2 diabetes mellitus with hyperglycemia ([place]) - Primary   Relevant Orders   Urine Microalbumin w/creat. ratio   Hemoglobin A1c    CBC with Differential/Platelet   Comprehensive metabolic panel     Other   Urinary frequency   Relevant Orders   Urine Culture   VITAMIN D 25 Hydroxy (Vit-D Deficiency, Fractures)   Urinalysis, Routine w reflex microscopic   PSA   Vitamin D deficiency   Right hand pain   Relevant Orders   DG Hand Complete Right (Completed)   Current Meds  Medication Sig   albuterol (VENTOLIN HFA) 108 (90 Base) MCG/ACT inhaler Inhale 1-2 puffs into the lungs every 4 (four) hours as needed for wheezing or shortness of breath.   blood glucose meter kit and supplies Dispense based on patient and insurance preference. Use up to four times daily as directed. (FOR ICD-10 E10.9, E11.9). check glucose fasting in am and TID before meals.   budesonide-formoterol (SYMBICORT) 160-4.5 MCG/ACT inhaler Inhale 2 puffs into the lungs 2 (two) times daily.   metFORMIN (GLUCOPHAGE XR) 500 MG 24 hr tablet Take 1 tablet (500 mg total) by mouth 2 (two) times daily.   Spacer/Aero-Holding Chambers (AEROCHAMBER PLUS) inhaler Use with inhaler   Vitamin D, Ergocalciferol, (DRISDOL) 1.25 MG (50000 UNIT) CAPS capsule Take 1 capsule (50,000 Units total) by mouth every 7 (seven) days. (taking one tablet per week) scheduled Vitamin D lab in  1-2 weeks after completing prescription.    No refills needed today. Continue metformin 500 mg BID, may be able to decrease to once daily if he continues to do so well with diet changes and  intentional weight loss.  Congratulated him on lifestyle changes.'   X ray of right hand and wrist today, follow up walk in [facility] if pain persists.   Rest and elevate the affected painful area.  Apply cold compresses intermittently as needed.  As pain recedes, begin normal activities slowly as tolerated.  Call if symptoms persist.   Urine today, advised him to follow up with his urologist.   Red Flags discussed. The patient was given clear instructions to go to ER or return to medical center  if any red flags develop, symptoms do not improve, worsen or new problems develop. They verbalized understanding.  Patient is aware that his primary care provider is leaving the office and last day will be February 22, 2022 and he will need to establish care with a new primary care provider, list is available upfront for patient to help him with establishing care or they can choose a provider of their choice.Patient verbalized understanding of all instructions given and denies any further questions at this time.     No orders of the defined types were placed in this encounter.  Return in about 3 months (around 05/09/2022), or if symptoms worsen or fail to improve, for at any time for any worsening symptoms.   Marcille Buffy, FNP

## 2022-02-06 NOTE — Telephone Encounter (Signed)
Pt see [facility] as his provider. Pt has been through 4 providers here at the office. Pt wife see NP Arnett. Pt was wondering if she is welling to take him as a new patient.  ?

## 2022-02-06 NOTE — Patient Instructions (Signed)
Hand Pain Many things can cause hand pain. Some common causes are: An injury. Repeating the same movement with your hand over and over (overuse). Osteoporosis. Arthritis. Lumps in the tendons or joints of the hand and wrist (ganglion cysts). Nerve compression syndromes (carpal tunnel syndrome). Inflammation of the tendons (tendinitis). Infection. Follow these instructions at home: Pay attention to any changes in your symptoms. Take these actions to help with your discomfort: Managing pain, stiffness, and swelling  Take over-the-counter and prescription medicines only as told by your health care provider. Wear a hand splint or support as told by your health care provider. If directed, put ice on the affected area: Put ice in a plastic bag. Place a towel between your skin and the bag. Leave the ice on for 20 minutes, 2-3 times a day. Activity Take breaks from repetitive activity often. Avoid activities that make your pain worse. Minimize stress on your hands and wrists as much as possible. Do stretches or exercises as told by your health care provider. Do not do activities that make your pain worse. Contact a health care provider if: Your pain does not get better after a few days of self-care. Your pain gets worse. Your pain affects your ability to do your daily activities. Get help right away if: Your hand becomes warm, red, or swollen. Your hand is numb or tingling. Your hand is extremely swollen or deformed. Your hand or fingers turn white or blue. You cannot move your hand, wrist, or fingers. Summary Many things can cause hand pain. Contact your health care provider if your pain does not get better after a few days of self care. Minimize stress on your hands and wrists as much as possible. Do not do activities that make your pain worse. This information is not intended to replace advice given to you by your health care provider. Make sure you discuss any questions you have  with your health care provider. Document Revised: 03/09/2021 Document Reviewed: 03/09/2021 Elsevier Patient Education  2022 Elsevier Inc. Diabetes Mellitus and Nutrition, Adult When you have diabetes, or diabetes mellitus, it is very important to have healthy eating habits because your blood sugar (glucose) levels are greatly affected by what you eat and drink. Eating healthy foods in the right amounts, at about the same times every day, can help you: Manage your blood glucose. Lower your risk of heart disease. Improve your blood pressure. Reach or maintain a healthy weight. What can affect my meal plan? Every person with diabetes is different, and each person has different needs for a meal plan. Your health care provider may recommend that you work with a dietitian to make a meal plan that is best for you. Your meal plan may vary depending on factors such as: The calories you need. The medicines you take. Your weight. Your blood glucose, blood pressure, and cholesterol levels. Your activity level. Other health conditions you have, such as heart or kidney disease. How do carbohydrates affect me? Carbohydrates, also called carbs, affect your blood glucose level more than any other type of food. Eating carbs raises the amount of glucose in your blood. It is important to know how many carbs you can safely have in each meal. This is different for every person. Your dietitian can help you calculate how many carbs you should have at each meal and for each snack. How does alcohol affect me? Alcohol can cause a decrease in blood glucose (hypoglycemia), especially if you use insulin or take certain diabetes medicines  by mouth. Hypoglycemia can be a life-threatening condition. Symptoms of hypoglycemia, such as sleepiness, dizziness, and confusion, are similar to symptoms of having too much alcohol. Do not drink alcohol if: Your health care provider tells you not to drink. You are pregnant, may be  pregnant, or are planning to become pregnant. If you drink alcohol: Limit how much you have to: 0-1 drink a day for women. 0-2 drinks a day for men. Know how much alcohol is in your drink. In the [place] one drink equals one 12 oz bottle of beer (355 mL), one 5 oz glass of wine (148 mL), or one 1 oz glass of hard liquor (44 mL). Keep yourself hydrated with water, diet soda, or unsweetened iced tea. Keep in mind that regular soda, juice, and other mixers may contain a lot of sugar and must be counted as carbs. What are tips for following this plan? Reading food labels Start by checking the serving size on the Nutrition Facts label of packaged foods and drinks. The number of calories and the amount of carbs, fats, and other nutrients listed on the label are based on one serving of the item. Many items contain more than one serving per package. Check the total grams (g) of carbs in one serving. Check the number of grams of saturated fats and trans fats in one serving. Choose foods that have a low amount or none of these fats. Check the number of milligrams (mg) of salt (sodium) in one serving. Most people should limit total sodium intake to less than 2,300 mg per day. Always check the nutrition information of foods labeled as "low-fat" or "nonfat." These foods may be higher in added sugar or refined carbs and should be avoided. Talk to your dietitian to identify your daily goals for nutrients listed on the label. Shopping Avoid buying canned, pre-made, or processed foods. These foods tend to be high in fat, sodium, and added sugar. Shop around the outside edge of the grocery store. This is where you will most often find fresh fruits and vegetables, bulk grains, fresh meats, and fresh dairy products. Cooking Use low-heat cooking methods, such as baking, instead of high-heat cooking methods, such as deep frying. Cook using healthy oils, such as olive, canola, or sunflower oil. Avoid cooking with  butter, cream, or high-fat meats. Meal planning Eat meals and snacks regularly, preferably at the same times every day. Avoid going long periods of time without eating. Eat foods that are high in fiber, such as fresh fruits, vegetables, beans, and whole grains. Eat 4-6 oz (112-168 g) of lean protein each day, such as lean meat, chicken, fish, eggs, or tofu. One ounce (oz) (28 g) of lean protein is equal to: 1 oz (28 g) of meat, chicken, or fish. 1 egg.  cup (62 g) of tofu. Eat some foods each day that contain healthy fats, such as avocado, nuts, seeds, and fish. What foods should I eat? Fruits Berries. Apples. Oranges. Peaches. Apricots. Plums. Grapes. Mangoes. Papayas. Pomegranates. Kiwi. Cherries. Vegetables Leafy greens, including lettuce, spinach, kale, chard, collard greens, mustard greens, and cabbage. Beets. Cauliflower. Broccoli. Carrots. Green beans. Tomatoes. Peppers. Onions. Cucumbers. Brussels sprouts. Grains Whole grains, such as whole-wheat or whole-grain bread, crackers, tortillas, cereal, and pasta. Unsweetened oatmeal. Quinoa. Brown or wild rice. Meats and other proteins Seafood. Poultry without skin. Lean cuts of poultry and beef. Tofu. Nuts. Seeds. Dairy Low-fat or fat-free dairy products such as milk, yogurt, and cheese. The items listed above may not be  a complete list of foods and beverages you can eat and drink. Contact a dietitian for more information. What foods should I avoid? Fruits Fruits canned with syrup. Vegetables Canned vegetables. Frozen vegetables with butter or cream sauce. Grains Refined white flour and flour products such as bread, pasta, snack foods, and cereals. Avoid all processed foods. Meats and other proteins Fatty cuts of meat. Poultry with skin. Breaded or fried meats. Processed meat. Avoid saturated fats. Dairy Full-fat yogurt, cheese, or milk. Beverages Sweetened drinks, such as soda or iced tea. The items listed above may not be a  complete list of foods and beverages you should avoid. Contact a dietitian for more information. Questions to ask a health care provider Do I need to meet with a certified diabetes care and education specialist? Do I need to meet with a dietitian? What number can I call if I have questions? When are the best times to check my blood glucose? Where to find more information: American Diabetes Association: diabetes.org Academy of Nutrition and Dietetics: eatright.[employer]org National Institute of Diabetes and Digestive and Kidney Diseases: StageSync.siniddk.nih.gov Association of Diabetes Care & Education Specialists: diabeteseducator.org Summary It is important to have healthy eating habits because your blood sugar (glucose) levels are greatly affected by what you eat and drink. It is important to use alcohol carefully. A healthy meal plan will help you manage your blood glucose and lower your risk of heart disease. Your health care provider may recommend that you work with a dietitian to make a meal plan that is best for you. This information is not intended to replace advice given to you by your health care provider. Make sure you discuss any questions you have with your health care provider. Document Revised: 06/22/2020 Document Reviewed: 06/22/2020 Elsevier Patient Education  2022 Elsevier Inc. Diabetes Mellitus Action Plan Following a diabetes action plan is a way for you to manage your diabetes (diabetes mellitus) symptoms. The plan is color-coded to help you understand what actions you need to take based on any symptoms you are having. If you have symptoms in the red zone, you need medical care right away. If you have symptoms in the yellow zone, you are having problems. If you have symptoms in the green zone, you are doing well. Learning about and understanding diabetes can take time. Follow the plan that you develop with your health care provider. Know the target range for your blood sugar (glucose) level,  and review your treatment plan with your health care provider at each visit. The target range for my blood sugar level is __________________________ mg/dL. Red zone Get medical help right away if you have any of the following symptoms: A blood sugar test result that is below 54 mg/dL (3 mmol/L). A blood sugar test result that is at or above 240 mg/dL (96.013.3 mmol/L) for 2 days in a row. Confusion or trouble thinking clearly. Difficulty breathing. Sickness or a fever for 2 or more days that is not getting better. Moderate or large ketone levels in your urine. Feeling tired or having no energy. If you have any red zone symptoms, do not wait to see if the symptoms will go away. Get medical help right away. Call your local emergency services (911 in the [place] Do not drive yourself to the hospital. If you have severely low blood sugar (severe hypoglycemia) and you cannot eat or drink, you may need glucagon. Make sure a family member or close friend knows how to check your blood sugar and how  to give you glucagon. You may need to be treated in a hospital for this condition. Yellow zone If you have any of the following symptoms, your diabetes is not under control and you may need to make some changes: A blood sugar test result that is at or above 240 mg/dL (22.0 mmol/L) for 2 days in a row. Blood sugar test results that are below 70 mg/dL (3.9 mmol/L). Other symptoms of hypoglycemia, such as: Shaking or feeling light-headed. Confusion or irritability. Feeling hungry. Having a fast heartbeat. If you have any yellow zone symptoms: Treat your hypoglycemia by eating or drinking 15 grams of a rapid-acting carbohydrate. Follow the 15:15 rule: Take 15 grams of a rapid-acting carbohydrate, such as: 1 tube of glucose gel. 4 glucose pills. 4 oz (120 mL) of fruit juice. 4 oz (120 mL) of regular (not diet) soda. Check your blood sugar 15 minutes after you take the carbohydrate. If the repeat blood sugar  test is still at or below 70 mg/dL (3.9 mmol/L), take 15 grams of a carbohydrate again. If your blood sugar does not increase above 70 mg/dL (3.9 mmol/L) after 3 tries, get medical help right away. After your blood sugar returns to normal, eat a meal or a snack within 1 hour. Keep taking your daily medicines as told by your health care provider. Check your blood sugar more often than you normally would. Write down your results. Call your health care provider if you have trouble keeping your blood sugar in your target range.  Green zone These signs mean you are doing well and you can continue what you are doing to manage your diabetes: Your blood sugar is within your personal target range. For most people, a blood sugar level before a meal (preprandial) should be 80-130 mg/dL (2.5-4.2 mmol/L). You feel well, and you are able to do daily activities. If you are in the green zone, continue to manage your diabetes as told by your health care provider. To do this: Eat a healthy diet. Exercise regularly. Check your blood sugar as told by your health care provider. Take your medicines as told by your health care provider.  Where to find more information American Diabetes Association (ADA): diabetes.org Association of Diabetes Care & Education Specialists (ADCES): diabeteseducator.org Summary Following a diabetes action plan is a way for you to manage your diabetes symptoms. The plan is color-coded to help you understand what actions you need to take based on any symptoms you are having. Follow the plan that you develop with your health care provider. Make sure you know your personal target blood sugar level. Review your treatment plan with your health care provider at each visit. This information is not intended to replace advice given to you by your health care provider. Make sure you discuss any questions you have with your health care provider. Document Revised: 05/26/2020 Document Reviewed:  05/26/2020 Elsevier Patient Education  2022 [employer].

## 2022-02-06 NOTE — Progress Notes (Signed)
Right hand/ right wrist is within normal limits without evidence of dislocation or fracture and  if pain persists after trying: Rest and elevate the affected painful area.  Apply cold compresses intermittently as needed.  As pain recedes, begin normal activities slowly as tolerated.   ?Then he should walk in at [facility] for further evaluation.

## 2022-02-06 NOTE — Telephone Encounter (Signed)
-----   Message from Berniece Pap, FNP sent at 02/06/2022  2:38 PM EST ----- ?Right hand/ right wrist is within normal limits without evidence of dislocation or fracture and  if pain persists after trying: Rest and elevate the affected painful area.  Apply cold compresses intermittently as needed.  As pain recedes, begin normal activities slowly as tolerated.   ?Then he should walk in at [facility] for further evaluation.  ?

## 2022-02-07 LAB — URINE CULTURE
MICRO NUMBER:: 13098242
Result:: NO GROWTH
SPECIMEN QUALITY:: ADEQUATE

## 2022-02-07 NOTE — Progress Notes (Signed)
CBC ok, mild irregularities noted, recommend him have a CBC rechecked with new PCP within 3 months.  ?PSA for prostate is within normal.  ?Vitamin D is within normal finish script vitamin D course and then start vitamin D 3 at 2,000 IU international units once daily. Would also have vitamin D level rechecked in 3- 4 months as well at lab.  ?Urine micro albumin ranges within normal limits.  ?Urinalysis is negative.  ?CMP within normal limits.  ?Urine culture is still pending if normal- then would advise him have another follow up with his urologist Dr. Danelle Berry at [facility].

## 2022-02-08 ENCOUNTER — Telehealth: Payer: Self-pay

## 2022-02-08 NOTE — Telephone Encounter (Signed)
-----   Message from Berniece Pap, FNP sent at 02/07/2022  8:28 AM EST ----- ?CBC ok, mild irregularities noted, recommend him have a CBC rechecked with new PCP within 3 months.  ?PSA for prostate is within normal.  ?Vitamin D is within normal finish script vitamin D course and then start vitamin D 3 at 2,000 IU international units once daily. Would also have vitamin D level rechecked in 3- 4 months as well at lab.  ?Urine micro albumin ranges within normal limits.  ?Urinalysis is negative.  ?CMP within normal limits.  ?Urine culture is still pending if normal- then would advise him have another follow up with his urologist Dr. Danelle Berry at [facility].  ?

## 2022-02-08 NOTE — Telephone Encounter (Signed)
Jenetta informed me that you said okay to schedule patient & I have done so for April.  ?

## 2022-02-08 NOTE — Progress Notes (Signed)
No growth in urine

## 2022-02-28 ENCOUNTER — Encounter: Payer: Self-pay | Admitting: Emergency Medicine

## 2022-02-28 ENCOUNTER — Emergency Department
Admission: EM | Admit: 2022-02-28 | Discharge: 2022-02-28 | Disposition: A | Discharge status: Home / Self Care | Payer: 59 | Attending: Emergency Medicine | Admitting: Emergency Medicine

## 2022-02-28 ENCOUNTER — Other Ambulatory Visit: Payer: Self-pay

## 2022-02-28 DIAGNOSIS — Z7984 Long term (current) use of oral hypoglycemic drugs: Secondary | ICD-10-CM | POA: Insufficient documentation

## 2022-02-28 DIAGNOSIS — E119 Type 2 diabetes mellitus without complications: Secondary | ICD-10-CM | POA: Diagnosis not present

## 2022-02-28 DIAGNOSIS — S3093XA Unspecified superficial injury of penis, initial encounter: Secondary | ICD-10-CM | POA: Diagnosis present

## 2022-02-28 DIAGNOSIS — Z20822 Contact with and (suspected) exposure to covid-19: Secondary | ICD-10-CM | POA: Diagnosis not present

## 2022-02-28 DIAGNOSIS — X58XXXA Exposure to other specified factors, initial encounter: Secondary | ICD-10-CM | POA: Diagnosis not present

## 2022-02-28 DIAGNOSIS — J45909 Unspecified asthma, uncomplicated: Secondary | ICD-10-CM | POA: Insufficient documentation

## 2022-02-28 DIAGNOSIS — D72829 Elevated white blood cell count, unspecified: Secondary | ICD-10-CM | POA: Insufficient documentation

## 2022-02-28 DIAGNOSIS — J029 Acute pharyngitis, unspecified: Secondary | ICD-10-CM | POA: Diagnosis not present

## 2022-02-28 DIAGNOSIS — S3121XA Laceration without foreign body of penis, initial encounter: Secondary | ICD-10-CM | POA: Diagnosis not present

## 2022-02-28 DIAGNOSIS — Z7951 Long term (current) use of inhaled steroids: Secondary | ICD-10-CM | POA: Diagnosis not present

## 2022-02-28 LAB — RESP PANEL BY RT-PCR (FLU A&B, COVID) ARPGX2
Influenza A by PCR: NEGATIVE
Influenza B by PCR: NEGATIVE
SARS Coronavirus 2 by RT PCR: NEGATIVE

## 2022-02-28 LAB — COMPREHENSIVE METABOLIC PANEL
ALT: 33 U/L (ref 0–44)
AST: 24 U/L (ref 15–41)
Albumin: 4.7 g/dL (ref 3.5–5.0)
Alkaline Phosphatase: 76 U/L (ref 38–126)
Anion gap: 7 (ref 5–15)
BUN: 15 mg/dL (ref 6–20)
CO2: 26 mmol/L (ref 22–32)
Calcium: 9 mg/dL (ref 8.9–10.3)
Chloride: 101 mmol/L (ref 98–111)
Creatinine, Ser: 1.19 mg/dL (ref 0.61–1.24)
GFR, Estimated: >60 mL/min (ref >60)
Glucose, Bld: 118 mg/dL — ABNORMAL HIGH (ref 70–99)
Potassium: 4 mmol/L (ref 3.5–5.1)
Sodium: 134 mmol/L — ABNORMAL LOW (ref 135–145)
Total Bilirubin: 1 mg/dL (ref 0.3–1.2)
Total Protein: 8.2 g/dL — ABNORMAL HIGH (ref 6.5–8.1)

## 2022-02-28 LAB — CBC WITH DIFFERENTIAL/PLATELET
Abs Immature Granulocytes: 0.05 10*3/uL (ref 0.00–0.07)
Basophils Absolute: 0 10*3/uL (ref 0.0–0.1)
Basophils Relative: 0 %
Eosinophils Absolute: 0.2 10*3/uL (ref 0.0–0.5)
Eosinophils Relative: 1 %
HCT: 43.1 % (ref 39.0–52.0)
Hemoglobin: 14.5 g/dL (ref 13.0–17.0)
Immature Granulocytes: 0 %
Lymphocytes Relative: 19 %
Lymphs Abs: 2.3 10*3/uL (ref 0.7–4.0)
MCH: 28.2 pg (ref 26.0–34.0)
MCHC: 33.6 g/dL (ref 30.0–36.0)
MCV: 83.7 fL (ref 80.0–100.0)
Monocytes Absolute: 1 10*3/uL (ref 0.1–1.0)
Monocytes Relative: 8 %
Neutro Abs: 8.8 10*3/uL — ABNORMAL HIGH (ref 1.7–7.7)
Neutrophils Relative %: 72 %
Platelets: 245 10*3/uL (ref 150–400)
RBC: 5.15 MIL/uL (ref 4.22–5.81)
RDW: 13.2 % (ref 11.5–15.5)
WBC: 12.4 10*3/uL — ABNORMAL HIGH (ref 4.0–10.5)
nRBC: 0 % (ref 0.0–0.2)

## 2022-02-28 LAB — GROUP A STREP BY PCR: Group A Strep by PCR: NOT DETECTED

## 2022-02-28 MED ORDER — DEXAMETHASONE 1 MG/ML PO CONC
10.0000 mg | Freq: Once | ORAL | Status: AC
  Administered 2022-02-28: 10 mg via ORAL
  Filled 2022-02-28: qty 10

## 2022-02-28 MED ORDER — MAGIC MOUTHWASH: ORAL | 0 refills | Status: DC

## 2022-02-28 MED ORDER — BACITRACIN ZINC 500 UNIT/GM EX OINT
TOPICAL_OINTMENT | Freq: Once | CUTANEOUS | Status: AC
  Administered 2022-02-28: 1 via TOPICAL
  Filled 2022-02-28: qty 0.9

## 2022-02-28 MED ORDER — IBUPROFEN 800 MG PO TABS
800.0000 mg | ORAL_TABLET | Freq: Once | ORAL | Status: AC
  Administered 2022-02-28: 800 mg via ORAL
  Filled 2022-02-28: qty 1

## 2022-02-28 MED ORDER — MAGIC MOUTHWASH
10.0000 mL | Freq: Once | ORAL | Status: AC
Start: 2022-02-28 — End: 2022-02-28
  Administered 2022-02-28: 10 mL via ORAL
  Filled 2022-02-28: qty 10

## 2022-02-28 NOTE — Discharge Instructions (Addendum)
You may take Magic mouthwash as needed for throat discomfort.  You may apply thin layer of Neosporin to penile wound daily x3 days.  Keep area clean and dry.  Do not have sexual intercourse until wound is completely healed.  You may check MyChart for the results of your COVID test.  Return to the ER for worsening symptoms, persistent vomiting, difficulty breathing or other concerns. ?

## 2022-02-28 NOTE — ED Triage Notes (Addendum)
Pt presents with a complaint of penile tear below the head of his penis (frenulum) around 2200 last night while having intercourse with his wife and currently has it wrapped in gauze. He stated that the bleeding had subsided before bed but he woke up and noticed that the bleeding had returned. Also he is complaining of a sore throat starting last night. Denies CP or SOB.  ?

## 2022-02-28 NOTE — ED Provider Notes (Addendum)
? [facility] ?Provider Note ? ? ? Event Date/Time  ? First MD Initiated Contact with Patient 02/28/22 0435   ?  (approximate) ? ? ?History  ? ?Sore throat, penile tear ? ? ?HPI ? ?Brandon Marsh is a 36 y.o. male who presents to the ED from home with a chief complaint of sore throat and penile tear.  Patient reports tear below the head of his penis around 10 PM last night while having unprotected intercourse with his wife.  He wrapped it to gauze and went to bed, awoke in the night and noted it was still bleeding.  Secondarily patient is complaining of a sore throat starting last night associated with congestion and slight cough.  Daughter sick with similar symptoms.  Denies fever, chest pain, shortness of breath, abdominal pain, nausea, vomiting or dizziness.  Denies anticoagulant use. ?  ? ? ?Past Medical History  ? ?Past Medical History:  ?Diagnosis Date  ? Alopecia   ? Anxiety and depression 04/21/2020  ? Asthma   ? Elevated blood pressure reading without diagnosis of hypertension   ? Wrist pain, left 04/21/2020  ? ? ? ?Active Problem List  ? ?Patient Active Problem List  ? Diagnosis Date Noted  ? Vitamin D deficiency 02/06/2022  ? Right hand pain 02/06/2022  ? Uncontrolled type 2 diabetes mellitus with hyperglycemia ([place]) 12/11/2021  ? Urinary frequency 12/11/2021  ? Class 2 severe obesity due to excess calories with serious comorbidity and body mass index (BMI) of 35.0 to 35.9 in adult [facility]) 10/24/2020  ? Encounter for preventative adult health care exam with abnormal findings 10/24/2020  ? Elevated BP without diagnosis of hypertension 10/11/2020  ? Anxiety 04/21/2020  ? Wrist pain, left 04/21/2020  ? Knee pain, right 05/05/2015  ? Asthma   ? ? ? ?Past Surgical History  ? ?Past Surgical History:  ?Procedure Laterality Date  ? WISDOM TOOTH EXTRACTION    ? ? ? ?Home Medications  ? ?Prior to Admission medications   ?Medication Sig Start Date End Date Taking? Authorizing Provider   ?albuterol (VENTOLIN HFA) 108 (90 Base) MCG/ACT inhaler Inhale 1-2 puffs into the lungs every 4 (four) hours as needed for wheezing or shortness of breath. 11/15/21   Flinchum, Kelby Aline, FNP  ?blood glucose meter kit and supplies Dispense based on patient and insurance preference. Use up to four times daily as directed. (FOR ICD-10 E10.9, E11.9). check glucose fasting in am and TID before meals. 12/11/21   Flinchum, Kelby Aline, FNP  ?budesonide-formoterol (SYMBICORT) 160-4.5 MCG/ACT inhaler Inhale 2 puffs into the lungs 2 (two) times daily. 11/15/21   Flinchum, Kelby Aline, FNP  ?metFORMIN (GLUCOPHAGE XR) 500 MG 24 hr tablet Take 1 tablet (500 mg total) by mouth 2 (two) times daily. 12/28/21   Flinchum, Kelby Aline, FNP  ?Spacer/Aero-Holding Chambers (AEROCHAMBER PLUS) inhaler Use with inhaler 11/15/21   Flinchum, Kelby Aline, FNP  ?Vitamin D, Ergocalciferol, (DRISDOL) 1.25 MG (50000 UNIT) CAPS capsule Take 1 capsule (50,000 Units total) by mouth every 7 (seven) days. (taking one tablet per week) scheduled Vitamin D lab in  1-2 weeks after completing prescription. 11/29/21   Flinchum, Kelby Aline, FNP  ? ? ? ?Allergies  ?Patient has no known allergies. ? ? ?Family History  ? ?Family History  ?Problem Relation Age of Onset  ? Asthma Father   ? Hypertension Mother   ? Diabetes Maternal Grandmother   ? Hypertension Maternal Grandmother   ? Alcohol abuse Paternal Grandmother   ? ? ? ?  Physical Exam  ?Triage Vital Signs: ?ED Triage Vitals  ?Enc Vitals Group  ?   BP 02/28/22 0414 138/88  ?   Pulse Rate 02/28/22 0414 100  ?   Resp 02/28/22 0414 18  ?   Temp 02/28/22 0414 100 ?F (37.8 ?C)  ?   Temp Source 02/28/22 0414 Oral  ?   SpO2 02/28/22 0414 94 %  ?   Weight 02/28/22 0416 203 lb (92.1 kg)  ?   Height 02/28/22 0416 '5\' 6"'  (1.676 m)  ?   Head Circumference --   ?   Peak Flow --   ?   Pain Score 02/28/22 0416 0  ?   Pain Loc --   ?   Pain Edu? --   ?   Excl. in [place]? --   ? ? ?Updated Vital Signs: ?BP 138/88   Pulse 100   Temp  100 ?F (37.8 ?C) (Oral)   Resp 18   Ht '5\' 6"'  (1.676 m)   Wt 92.1 kg   SpO2 94%   BMI 32.77 kg/m?  ? ? ?General: Awake, no distress.  ?CV:  Good peripheral perfusion.  ?Resp:  Normal effort.  ?Abd:  No distention.  ?Other:  Mildly erythematous oropharynx with slightly symmetrically swollen tonsils without exudates or peritonsillar abscess.  No hoarse or muffled voice.  No drooling.  No lymphadenopathy.  Small frenulum tear to penis without active bleeding. ? ? ?ED Results / Procedures / Treatments  ?Labs ?(all labs ordered are listed, but only abnormal results are displayed) ?Labs Reviewed  ?CBC WITH DIFFERENTIAL/PLATELET - Abnormal; Notable for the following components:  ?    Result Value  ? WBC 12.4 (*)   ? Neutro Abs 8.8 (*)   ? All other components within normal limits  ?COMPREHENSIVE METABOLIC PANEL - Abnormal; Notable for the following components:  ? Sodium 134 (*)   ? Glucose, Bld 118 (*)   ? Total Protein 8.2 (*)   ? All other components within normal limits  ?GROUP A STREP BY PCR  ?RESP PANEL BY RT-PCR (FLU A&B, COVID) [id number]  ? ? ? ?EKG ? ?None ? ? ?RADIOLOGY ?None ? ? ?Official radiology report(s): ?No results found. ? ? ?PROCEDURES: ? ?Critical Care performed: No ? ?Procedures ? ? ?MEDICATIONS ORDERED IN ED: ?Medications  ?dexamethasone (DECADRON) 1 MG/ML solution 10 mg (10 mg Oral Given 02/28/22 0512)  ?magic mouthwash (10 mLs Oral Given 02/28/22 0512)  ?ibuprofen (ADVIL) tablet 800 mg (800 mg Oral Given 02/28/22 0512)  ?bacitracin ointment (1 application. Topical Given 02/28/22 0511)  ? ? ? ?IMPRESSION / MDM / ASSESSMENT AND PLAN / ED COURSE  ?I reviewed the triage vital signs and the nursing notes. ?             ?               ?36 year old male presenting with sore throat and frenulum tear.  Laboratory results demonstrate mild leukocytosis with WBC 12.4.  Awaiting group A strep results.  Patient is interested in obtaining respiratory panel and will follow-up those results on MyChart.  Will clean  frenulum tear, apply thin layer bacitracin and Xeroform nonadhesive dressing.  Will administer oral Decadron, Magic mouthwash.  Will reassess. ? ?0608 ?Strep test is negative.  Will discharge home with prescription for Magic mouthwash to use as needed.  Advised wound care to frenulum tear.  Strict return precautions given.  Patient verbalizes understanding and agrees with plan of care. ? ?FINAL  CLINICAL IMPRESSION(S) / ED DIAGNOSES  ? ?Final diagnoses:  ?Tear of frenulum of foreskin  ?Sore throat  ?Pharyngitis, unspecified etiology  ? ? ? ?Rx / DC Orders  ? ?ED Discharge Orders   ? ? None  ? ?  ? ? ? ?Note:  This document was prepared using Dragon voice recognition software and may include unintentional dictation errors. ?  ?Paulette Blanch, MD ?02/28/22 [phone] ? ?  ?Paulette Blanch, MD ?02/28/22 [phone] ? ?

## 2022-03-05 ENCOUNTER — Ambulatory Visit
Admission: EM | Admit: 2022-03-05 | Discharge: 2022-03-05 | Disposition: A | Discharge status: Home / Self Care | Payer: 59 | Attending: Emergency Medicine | Admitting: Emergency Medicine

## 2022-03-05 ENCOUNTER — Other Ambulatory Visit: Payer: Self-pay

## 2022-03-05 DIAGNOSIS — J029 Acute pharyngitis, unspecified: Secondary | ICD-10-CM | POA: Insufficient documentation

## 2022-03-05 DIAGNOSIS — H66003 Acute suppurative otitis media without spontaneous rupture of ear drum, bilateral: Secondary | ICD-10-CM | POA: Diagnosis present

## 2022-03-05 DIAGNOSIS — J069 Acute upper respiratory infection, unspecified: Secondary | ICD-10-CM | POA: Insufficient documentation

## 2022-03-05 LAB — GROUP A STREP BY PCR: Group A Strep by PCR: NOT DETECTED

## 2022-03-05 MED ORDER — AMOXICILLIN-POT CLAVULANATE 875-125 MG PO TABS: 1.0000 | ORAL_TABLET | Freq: Two times a day (BID) | ORAL | 0 refills | Status: AC

## 2022-03-05 NOTE — Discharge Instructions (Signed)
Take the Augmentin twice daily for 10 days with food for treatment of your ear infection. ? ?Take an over-the-counter probiotic 1 hour after each dose of antibiotic to prevent diarrhea. ? ?Use over-the-counter Tylenol and ibuprofen as needed for pain or fever. ? ?Place a hot water bottle, or heating pad, underneath your pillowcase at night to help dilate up your ear and aid in pain relief as well as resolution of the infection. ? ?Gargle with warm salt water 2-3 times a day to soothe your throat, aid in pain relief, and aid in healing. ? ?Take over-the-counter ibuprofen according to the package instructions as needed for pain. ? ?You can also use Chloraseptic or Sucrets lozenges, 1 lozenge every 2 hours as needed for throat pain. ? ?If you develop any new or worsening symptoms return for reevaluation.  ?

## 2022-03-05 NOTE — ED Provider Notes (Signed)
?[facility] ? ? ? ?CSN: [id number] ?Arrival date & time: 03/05/22  0813 ? ? ?  ? ?History   ?Chief Complaint ?Chief Complaint  ?Patient presents with  ? Sore Throat  ?   ?  ? Otalgia  ?   ?  ? ? ?HPI ?Brandon Marsh is a 36 y.o. male.  ? ?HPI ? ?36 year old male here for evaluation of upper respiratory symptoms. ? ?Patient reports that he has been experiencing a sore throat and pain in both of his ears for last 5 days.  He has had a fever with a Tmax of 101.  He does endorse some nasal congestion with a green nasal discharge that occasionally has blood in it.  He states he is lost 8 pounds in the last 5 days due to his sore throat not being able to eat.  He denies any cough, shortness breath or wheezing, or GI complaints.  He has been using maximum strength Mucinex with acetaminophen and dextromethorphan in it without any improvement of symptoms.  He is little concerned because his blood pressure is elevated today at 151/97.  I did explain this is most likely secondary to the dextromethorphan and the Mucinex. ? ?Past Medical History:  ?Diagnosis Date  ? Alopecia   ? Anxiety and depression 04/21/2020  ? Asthma   ? Elevated blood pressure reading without diagnosis of hypertension   ? Wrist pain, left 04/21/2020  ? ? ?Patient Active Problem List  ? Diagnosis Date Noted  ? Vitamin D deficiency 02/06/2022  ? Right hand pain 02/06/2022  ? Uncontrolled type 2 diabetes mellitus with hyperglycemia ([place]) 12/11/2021  ? Urinary frequency 12/11/2021  ? Class 2 severe obesity due to excess calories with serious comorbidity and body mass index (BMI) of 35.0 to 35.9 in adult [facility]) 10/24/2020  ? Encounter for preventative adult health care exam with abnormal findings 10/24/2020  ? Elevated BP without diagnosis of hypertension 10/11/2020  ? Anxiety 04/21/2020  ? Wrist pain, left 04/21/2020  ? Knee pain, right 05/05/2015  ? Asthma   ? ? ?Past Surgical History:  ?Procedure Laterality Date  ? WISDOM TOOTH EXTRACTION     ? ? ? ? ? ?Home Medications   ? ?Prior to Admission medications   ?Medication Sig Start Date End Date Taking? Authorizing Provider  ?amoxicillin-clavulanate (AUGMENTIN) 875-125 MG tablet Take 1 tablet by mouth every 12 (twelve) hours for 10 days. 03/05/22 03/15/22 Yes Margarette Canada, NP  ?albuterol (VENTOLIN HFA) 108 (90 Base) MCG/ACT inhaler Inhale 1-2 puffs into the lungs every 4 (four) hours as needed for wheezing or shortness of breath. 11/15/21   Flinchum, Kelby Aline, FNP  ?blood glucose meter kit and supplies Dispense based on patient and insurance preference. Use up to four times daily as directed. (FOR ICD-10 E10.9, E11.9). check glucose fasting in am and TID before meals. 12/11/21   Flinchum, Kelby Aline, FNP  ?budesonide-formoterol (SYMBICORT) 160-4.5 MCG/ACT inhaler Inhale 2 puffs into the lungs 2 (two) times daily. 11/15/21   Flinchum, Kelby Aline, FNP  ?magic mouthwash SOLN 62m Anbesol ?313mBenadryl ?3016mylanta ? ?5mL30mish, gargle & spit q8hr prn throat discomfort 02/28/22   SungPaulette Blanch  ?metFORMIN (GLUCOPHAGE XR) 500 MG 24 hr tablet Take 1 tablet (500 mg total) by mouth 2 (two) times daily. 12/28/21   Flinchum, MichKelby AlineP  ?Spacer/Aero-Holding Chambers (AEROCHAMBER PLUS) inhaler Use with inhaler 11/15/21   Flinchum, MichKelby AlineP  ?Vitamin D, Ergocalciferol, (DRISDOL) 1.25 MG (50000 UNIT)  CAPS capsule Take 1 capsule (50,000 Units total) by mouth every 7 (seven) days. (taking one tablet per week) scheduled Vitamin D lab in  1-2 weeks after completing prescription. 11/29/21   Flinchum, Kelby Aline, FNP  ? ? ?Family History ?Family History  ?Problem Relation Age of Onset  ? Asthma Father   ? Hypertension Mother   ? Diabetes Maternal Grandmother   ? Hypertension Maternal Grandmother   ? Alcohol abuse Paternal Grandmother   ? ? ?Social History ?Social History  ? ?Tobacco Use  ? Smoking status: Never  ? Smokeless tobacco: Never  ?Vaping Use  ? Vaping Use: Never used  ?Substance Use Topics  ? Alcohol  use: Yes  ?  Alcohol/week: 0.0 standard drinks  ?  Comment: rarely  ? Drug use: No  ? ? ? ?Allergies   ?Patient has no known allergies. ? ? ?Review of Systems ?Review of Systems  ?Constitutional:  Positive for fever.  ?HENT:  Positive for congestion, ear pain, postnasal drip, rhinorrhea and sore throat. Negative for ear discharge.   ?Respiratory:  Negative for cough, shortness of breath and wheezing.   ?Gastrointestinal:  Negative for diarrhea, nausea and vomiting.  ?Musculoskeletal:  Negative for arthralgias and myalgias.  ?Skin:  Negative for rash.  ?Neurological:  Negative for headaches.  ?Hematological: Negative.   ?Psychiatric/Behavioral: Negative.    ? ? ?Physical Exam ?Triage Vital Signs ?ED Triage Vitals  ?Enc Vitals Group  ?   BP 03/05/22 0834 (!) 151/97  ?   Pulse Rate 03/05/22 0834 70  ?   Resp 03/05/22 0834 18  ?   Temp 03/05/22 0834 99.1 ?F (37.3 ?C)  ?   Temp Source 03/05/22 0834 Oral  ?   SpO2 03/05/22 0834 97 %  ?   Weight --   ?   Height --   ?   Head Circumference --   ?   Peak Flow --   ?   Pain Score 03/05/22 0833 7  ?   Pain Loc --   ?   Pain Edu? --   ?   Excl. in [place]? --   ? ?No data found. ? ?Updated Vital Signs ?BP (!) 151/97 (BP Location: Right Arm)   Pulse 70   Temp 99.1 ?F (37.3 ?C) (Oral)   Resp 18   SpO2 97%  ? ?Visual Acuity ?Right Eye Distance:   ?Left Eye Distance:   ?Bilateral Distance:   ? ?Right Eye Near:   ?Left Eye Near:    ?Bilateral Near:    ? ?Physical Exam ?Vitals and nursing note reviewed.  ?Constitutional:   ?   Appearance: Normal appearance. He is not ill-appearing.  ?HENT:  ?   Head: Normocephalic and atraumatic.  ?   Right Ear: Ear canal and external ear normal. There is no impacted cerumen.  ?   Left Ear: Ear canal and external ear normal. There is no impacted cerumen.  ?   Nose: Congestion and rhinorrhea present.  ?   Mouth/Throat:  ?   Mouth: Mucous membranes are moist.  ?   Pharynx: Oropharynx is clear. Posterior oropharyngeal erythema present. No oropharyngeal  exudate.  ?Cardiovascular:  ?   Rate and Rhythm: Normal rate and regular rhythm.  ?   Pulses: Normal pulses.  ?   Heart sounds: Normal heart sounds. No murmur heard. ?  No friction rub. No gallop.  ?Pulmonary:  ?   Effort: Pulmonary effort is normal.  ?   Breath sounds: Normal breath  sounds. No wheezing, rhonchi or rales.  ?Musculoskeletal:  ?   Cervical back: Normal range of motion and neck supple.  ?Lymphadenopathy:  ?   Cervical: No cervical adenopathy.  ?Skin: ?   General: Skin is warm and dry.  ?   Capillary Refill: Capillary refill takes less than 2 seconds.  ?   Findings: No erythema or rash.  ?Neurological:  ?   General: No focal deficit present.  ?   Mental Status: He is alert and oriented to person, place, and time.  ?Psychiatric:     ?   Mood and Affect: Mood normal.     ?   Behavior: Behavior normal.     ?   Thought Content: Thought content normal.     ?   Judgment: Judgment normal.  ? ? ? ?UC Treatments / Results  ?Labs ?(all labs ordered are listed, but only abnormal results are displayed) ?Labs Reviewed  ?GROUP A STREP BY PCR  ? ? ?EKG ? ? ?Radiology ?No results found. ? ?Procedures ?Procedures (including critical care time) ? ?Medications Ordered in UC ?Medications - No data to display ? ?Initial Impression / Assessment and Plan / UC Course  ?I have reviewed the triage vital signs and the nursing notes. ? ?Pertinent labs & imaging results that were available during my care of the patient were reviewed by me and considered in my medical decision making (see chart for details). ? ?36 year old male here for evaluation of sore throat and bilateral ear pain this been going on for the past 5 days as outlined HPI above.  He also endorses other upper respiratory symptoms.  On exam patient has bilateral erythematous and injected tympanic membranes with a loss of landmarks.  The external auditory canals are clear.  Nasal mucosa is erythematous and edematous with clear discharge in both nares.  Oropharyngeal  exam reveals mild posterior oropharyngeal erythema.  No significant tonsillar edema or exudate noted on exam.  No cervical lymphadenopathy appreciated on exam.  Cardiopulmonary exam reveals S1-S2 heart sounds with re

## 2022-03-05 NOTE — ED Triage Notes (Signed)
Pt reports sore throat and bilateral ear pain x 5 days. Mucinex gives no relief. ?

## 2022-03-19 ENCOUNTER — Encounter: Payer: Self-pay | Admitting: Family

## 2022-03-19 ENCOUNTER — Ambulatory Visit: Payer: 59 | Admitting: Family

## 2022-03-19 DIAGNOSIS — R030 Elevated blood-pressure reading, without diagnosis of hypertension: Secondary | ICD-10-CM

## 2022-03-19 DIAGNOSIS — J4521 Mild intermittent asthma with (acute) exacerbation: Secondary | ICD-10-CM

## 2022-03-19 DIAGNOSIS — E1165 Type 2 diabetes mellitus with hyperglycemia: Secondary | ICD-10-CM | POA: Diagnosis not present

## 2022-03-19 NOTE — Progress Notes (Signed)
? ?Subjective:  ? ? Patient ID: Brandon Marsh, male    DOB: 1986/01/01, 36 y.o.   MRN: [id number] ? ?CC: Brandon Marsh is a 36 y.o. male who presents today to establish care.   ? ?HPI: Feels well today ?No new complaints.  Recently treated for otitis media at urgent care 2 weeks ago.  Treated with Augmentin.  White blood cells elevated.  Denies fever, ear pain.   ? ?He prefers to have labs done with next A1c ? ?DM-compliant with metformin 500 mg BID.  He is lost 30 pounds over the past year intentionally due to exercising daily and eating low glycemic diet. ? ?Asthma-compliant with Symbicort 160-4.5 two puffs twice a day with good control of symptoms.  Denies  cough, wheezing. Rarely uses albuterol ? ?Previously seen urology 12/19/2021, Dr. Diamantina Providence for urinary frequency and mild incontinence, which had resolved.  ? ?Denies urinary frequency and urinary incontinence at this time ? ?BP at home 112/80.  Blood pressure is typically elevated in the doctor's office ? ? ?HISTORY:  ?Past Medical History:  ?Diagnosis Date  ? Alopecia   ? Anxiety and depression 04/21/2020  ? Asthma   ? Elevated blood pressure reading without diagnosis of hypertension   ? Wrist pain, left 04/21/2020  ? ?Past Surgical History:  ?Procedure Laterality Date  ? WISDOM TOOTH EXTRACTION    ? ?Family History  ?Problem Relation Age of Onset  ? Asthma Father   ? Hypertension Mother   ? Diabetes Maternal Grandmother   ? Hypertension Maternal Grandmother   ? Alcohol abuse Paternal Grandmother   ? ? ?Allergies: Patient has no known allergies. ?Current Outpatient Medications on File Prior to Visit  ?Medication Sig Dispense Refill  ? albuterol (VENTOLIN HFA) 108 (90 Base) MCG/ACT inhaler Inhale 1-2 puffs into the lungs every 4 (four) hours as needed for wheezing or shortness of breath. 1 each 0  ? blood glucose meter kit and supplies Dispense based on patient and insurance preference. Use up to four times daily as directed. (FOR ICD-10 E10.9, E11.9).  check glucose fasting in am and TID before meals. 1 each 0  ? budesonide-formoterol (SYMBICORT) 160-4.5 MCG/ACT inhaler Inhale 2 puffs into the lungs 2 (two) times daily. 1 each 12  ? magic mouthwash SOLN 88m Anbesol ?387mBenadryl ?3034mylanta ? ?5mL61mish, gargle & spit q8hr prn throat discomfort 72 mL 0  ? metFORMIN (GLUCOPHAGE XR) 500 MG 24 hr tablet Take 1 tablet (500 mg total) by mouth 2 (two) times daily. 180 tablet 1  ? Spacer/Aero-Holding Chambers (AEROCHAMBER PLUS) inhaler Use with inhaler 1 each 2  ? Vitamin D, Ergocalciferol, (DRISDOL) 1.25 MG (50000 UNIT) CAPS capsule Take 1 capsule (50,000 Units total) by mouth every 7 (seven) days. (taking one tablet per week) scheduled Vitamin D lab in  1-2 weeks after completing prescription. 12 capsule 0  ? ?No current facility-administered medications on file prior to visit.  ? ? ?Social History  ? ?Tobacco Use  ? Smoking status: Never  ? Smokeless tobacco: Never  ?Vaping Use  ? Vaping Use: Never used  ?Substance Use Topics  ? Alcohol use: Yes  ?  Alcohol/week: 0.0 standard drinks  ?  Comment: rarely  ? Drug use: No  ? ? ?Review of Systems  ?Constitutional:  Negative for chills and fever.  ?Respiratory:  Negative for cough.   ?Cardiovascular:  Negative for chest pain and palpitations.  ?Gastrointestinal:  Negative for nausea and vomiting.  ?   ?Objective:  ?  ?  BP 132/78 (BP Location: Left Arm, Patient Position: Sitting, Cuff Size: Large)   Pulse 96   Temp 98.7 ?F (37.1 ?C) (Oral)   Ht '5\' 6"'  (1.676 m)   Wt 205 lb 6.4 oz (93.2 kg)   SpO2 98%   BMI 33.15 kg/m?  ?BP Readings from Last 3 Encounters:  ?03/19/22 132/78  ?03/05/22 (!) 151/97  ?02/28/22 138/88  ? ?Wt Readings from Last 3 Encounters:  ?03/19/22 205 lb 6.4 oz (93.2 kg)  ?02/28/22 203 lb (92.1 kg)  ?02/06/22 206 lb (93.4 kg)  ? ? ?Physical Exam ?Vitals reviewed.  ?Constitutional:   ?   Appearance: He is well-developed.  ?Cardiovascular:  ?   Rate and Rhythm: Regular rhythm.  ?   Heart sounds: Normal  heart sounds.  ?Pulmonary:  ?   Effort: Pulmonary effort is normal. No respiratory distress.  ?   Breath sounds: Normal breath sounds. No wheezing, rhonchi or rales.  ?Skin: ?   General: Skin is warm and dry.  ?Neurological:  ?   Mental Status: He is alert.  ?Psychiatric:     ?   Speech: Speech normal.     ?   Behavior: Behavior normal.  ? ? ?   ?Assessment & Plan:  ? ?Problem List Items Addressed This Visit   ? ?  ? Respiratory  ? Asthma  ?  Excellent control.  Continue Symbicort 160-4.5 two puffs twice a day.  Rare use albuterol ? ?  ?  ?  ? Endocrine  ? Uncontrolled type 2 diabetes mellitus with hyperglycemia ([place])  ?  Lab Results  ?Component Value Date  ? HGBA1C 6.5 02/06/2022  ?Excellent control.  Continue metformin 500 mg twice daily.   ? ?  ?  ?  ? Other  ? Elevated blood pressure reading without diagnosis of hypertension  ?  Slightly above goal of 120/80 in the office today .  ?blood pressure readings at home are very well controlled, 120/80.  He continues to work on diet and plans to lose even more weight.  We will continue to monitor blood pressure closely.  ? ?  ?  ? ? ? ?I am having Brandon Marsh maintain his albuterol, budesonide-formoterol, AeroChamber Plus, Vitamin D (Ergocalciferol), blood glucose meter kit and supplies, metFORMIN, and magic mouthwash. ? ? ?No orders of the defined types were placed in this encounter. ? ? ?Return precautions given.  ? ?Risks, benefits, and alternatives of the medications and treatment plan prescribed today were discussed, and patient expressed understanding.  ? ?Education regarding symptom management and diagnosis given to patient on AVS. ? ?Continue to follow with Burnard Hawthorne, FNP for routine health maintenance.  ? ?Brandon Marsh and I agreed with plan.  ? ?Mable Paris, FNP ? ? ? ?

## 2022-03-19 NOTE — Assessment & Plan Note (Signed)
Excellent control.  Continue Symbicort 160-4.5 two puffs twice a day.  Rare use albuterol ?

## 2022-03-19 NOTE — Assessment & Plan Note (Signed)
Slightly above goal of 120/80 in the office today .  ?blood pressure readings at home are very well controlled, 120/80.  He continues to work on diet and plans to lose even more weight.  We will continue to monitor blood pressure closely.  ?

## 2022-03-19 NOTE — Assessment & Plan Note (Signed)
Lab Results  ?Component Value Date  ? HGBA1C 6.5 02/06/2022  ? ?Excellent control.  Continue metformin 500 mg twice daily.   ? ?

## 2022-03-19 NOTE — Patient Instructions (Signed)
Nice to meet you ? ?We will obtain labs at your next visit ?

## 2022-04-02 ENCOUNTER — Ambulatory Visit: Payer: 59 | Admitting: Family

## 2022-05-30 ENCOUNTER — Encounter: Payer: Self-pay | Admitting: Family

## 2022-05-30 ENCOUNTER — Ambulatory Visit: Payer: 59 | Admitting: Family

## 2022-05-30 VITALS — BP 136/82 | HR 81 | Temp 98.4°F | Ht 66.0 in | Wt 204.2 lb

## 2022-05-30 DIAGNOSIS — E1165 Type 2 diabetes mellitus with hyperglycemia: Secondary | ICD-10-CM

## 2022-05-30 DIAGNOSIS — R030 Elevated blood-pressure reading, without diagnosis of hypertension: Secondary | ICD-10-CM

## 2022-05-30 LAB — POCT GLYCOSYLATED HEMOGLOBIN (HGB A1C): Hemoglobin A1C: 5.8 % — AB (ref 4.0–5.6)

## 2022-05-30 MED ORDER — TIRZEPATIDE 2.5 MG/0.5ML ~~LOC~~ SOAJ: 2.5000 mg | SUBCUTANEOUS | 1 refills | Status: DC

## 2022-05-30 NOTE — Patient Instructions (Signed)
If Brandon Marsh is not covered through your insurance, you may go to the [employer] at [email or web address] to complete information for savings card.   You may also use the link below.   [email or web address]  You may take this savings to [facility], [facility] or [facility]. If you are unable to get mounjaro, please call to schedule an appointment with me so we can discuss alternative weight loss medications.   start [name] 2.'5mg'$  once per week injected subcutaneously ( [place])  in stomach. Please clean with alcohol swab prior to injection and be sure to rotate site. You may schedule a nurse visit if you would like to first injection.   After 4 weeks, and if tolerated and weight loss has not reached 1-2 lbs per week, please increase to '5mg'$  once per week [place].    Please read information on medication below and remember black box warning that you may not take if you or a family member is diagnosed with thyroid cancer (medullary thyroid cancer), or multiple endocrine neoplasia.       Tirzepatide Injection Brandon Marsh) What is this medication? TIRZEPATIDE (tir ZEP a tide) treats type 2 diabetes. It works by increasing insulin levels in your body, which decreases your blood sugar (glucose). Changes to diet and exercise are often combined with this medication. This medicine may be used for other purposes; ask your health care provider or pharmacist if you have questions. COMMON BRAND NAME(S): [name] What should I tell my care team before I take this medication? They need to know if you have any of these conditions: Endocrine tumors (MEN 2) or if someone in your family had these tumors Eye disease, vision problems Gallbladder disease History of pancreatitis Kidney disease Stomach or intestine problems Thyroid cancer or if someone in your family had thyroid cancer An unusual or allergic reaction to tirzepatide, other medications, foods, dyes, or preservatives Pregnant or  trying to get pregnant Breast-feeding How should I use this medication? This medication is injected under the skin. You will be taught how to prepare and give it. It is given once every week (every 7 days). Keep taking it unless your health care provider tells you to stop. If you use this medication with insulin, you should inject this medication and the insulin separately. Do not mix them together. Do not give the injections right next to each other. Change (rotate) injection sites with each injection. This medication comes with INSTRUCTIONS FOR USE. Ask your pharmacist for directions on how to use this medication. Read the information carefully. Talk to your pharmacist or care team if you have questions. It is important that you put your used needles and syringes in a special sharps container. Do not put them in a trash can. If you do not have a sharps container, call your pharmacist or care team to get one. A special MedGuide will be given to you by the pharmacist with each prescription and refill. Be sure to read this information carefully each time. Talk to your care team about the use of this medication in children. Special care may be needed. Overdosage: If you think you have taken too much of this medicine contact a poison control center or emergency room at once. NOTE: This medicine is only for you. Do not share this medicine with others. What if I miss a dose? If you miss a dose, take it as soon as you can unless it is more than 4 days (96 hours) late. If it is more than 4 days late,  skip the missed dose. Take the next dose at the normal time. Do not take 2 doses within 3 days of each other. What may interact with this medication? Alcohol containing beverages Antiviral medications for HIV or AIDS Aspirin and aspirin-like medications Beta-blockers like atenolol, metoprolol, propranolol Certain medications for blood pressure, heart disease, irregular heart  beat Chromium Clonidine Diuretics Male hormones, such as estrogens or progestins, birth control pills Fenofibrate Gemfibrozil Guanethidine Isoniazid Lanreotide Male hormones or anabolic steroids MAOIs like Carbex, Eldepryl, Marplan, Nardil, and Parnate Medications for weight loss Medications for allergies, asthma, cold, or cough Medications for depression, anxiety, or psychotic disturbances Niacin Nicotine NSAIDs, medications for pain and inflammation, like ibuprofen or naproxen Octreotide Other medications for diabetes, like glyburide, glipizide, or glimepiride Pasireotide Pentamidine Phenytoin Probenecid Quinolone antibiotics such as ciprofloxacin, levofloxacin, ofloxacin Reserpine Some herbal dietary supplements Steroid medications such as prednisone or cortisone Sulfamethoxazole; trimethoprim Thyroid hormones Warfarin This list may not describe all possible interactions. Give your health care provider a list of all the medicines, herbs, non-prescription drugs, or dietary supplements you use. Also tell them if you smoke, drink alcohol, or use illegal drugs. Some items may interact with your medicine. What should I watch for while using this medication? Visit your care team for regular checks on your progress. Drink plenty of fluids while taking this medication. Check with your care team if you get an attack of severe diarrhea, nausea, and vomiting. The loss of too much body fluid can make it dangerous for you to take this medication. A test called the HbA1C (A1C) will be monitored. This is a simple blood test. It measures your blood sugar control over the last 2 to 3 months. You will receive this test every 3 to 6 months. Learn how to check your blood sugar. Learn the symptoms of low and high blood sugar and how to manage them. Always carry a quick-source of sugar with you in case you have symptoms of low blood sugar. Examples include hard sugar candy or glucose tablets.  Make sure others know that you can choke if you eat or drink when you develop serious symptoms of low blood sugar, such as seizures or unconsciousness. They must get medical help at once. Tell your care team if you have high blood sugar. You might need to change the dose of your medication. If you are sick or exercising more than usual, you might need to change the dose of your medication. Do not skip meals. Ask your care team if you should avoid alcohol. Many nonprescription cough and cold products contain sugar or alcohol. These can affect blood sugar. Pens should never be shared. Even if the needle is changed, sharing may result in passing of viruses like hepatitis or HIV. Wear a medical ID bracelet or chain, and carry a card that describes your disease and details of your medication and dosage times. Birth control may not work properly while you are taking this medication. If you take birth control pills by mouth, your care team may recommend another type of birth control for 4 weeks after you start this medication and for 4 weeks after each increase in your dose of this medication. Ask your care team which birth control methods you should use. What side effects may I notice from receiving this medication? Side effects that you should report to your care team as soon as possible: Allergic reactions-skin rash, itching, hives, swelling of the face, lips, tongue, or throat Change in vision Dehydration-increased thirst, dry mouth,  feeling faint or lightheaded, headache, dark yellow or brown urine Gallbladder problems-severe stomach pain, nausea, vomiting, fever Kidney injury-decrease in the amount of urine, swelling of the ankles, hands, or feet Pancreatitis-severe stomach pain that spreads to your back or gets worse after eating or when touched, fever, nausea, vomiting Thyroid cancer-new mass or lump in the neck, pain or trouble swallowing, trouble breathing, hoarseness Side effects that usually do  not require medical attention (report these to your care team if they continue or are bothersome): Constipation Diarrhea Loss of Appetite Nausea Stomach pain Upset stomach Vomiting This list may not describe all possible side effects. Call your doctor for medical advice about side effects. You may report side effects to FDA at 1-800-FDA-1088. Where should I keep my medication? Keep out of the reach of children and pets. Refrigeration (preferred): Store unopened pens in a refrigerator between 2 and 8 degrees C (36 and 46 degrees F). Keep it in the original carton until you are ready to take it. Do not freeze or use if the medication has been frozen. Protect from light. Get rid of any unused medication after the expiration date on the label. Room Temperature: The pen may be stored at room temperature below 30 degrees C (86 degrees F) for up to a total of 21 days if needed. Protect from light. Avoid exposure to extreme heat. If it is stored at room temperature, throw away any unused medication after 21 days or after it expires, whichever is first. The pen has glass parts. Handle it carefully. If you drop the pen on a hard surface, do not use it. Use a new pen for your injection. To get rid of medications that are no longer needed or have expired: Take the medication to a medication take-back program. Check with your pharmacy or law enforcement to find a location. If you cannot return the medication, ask your pharmacist or care team how to get rid of this medication safely. NOTE: This sheet is a summary. It may not cover all possible information. If you have questions about this medicine, talk to your doctor, pharmacist, or health care provider.  2022 Elsevier/Gold Standard (2021-04-18 13:57:48)   Lets trial metformin  Start metformin XR with one 500mg  tablet at night. After one week, you may increase to two tablets at night ( total of 1000mg ) . The third week, you may take take two tablets at  night and one tablet in the morning.  The fourth week, you may take two tablets in the morning ( 1000mg  total) and two tablets at night (1000mg  total). This will bring you to a maximum daily dose of 2000mg /day which is maximum dose. Along the way, if you want to increase more slowly, please do as this medication can cause GI discomfort and loose stools which usually get better with time , however some patients find that they can only tolerate a certain dose and cannot increase to maximum dose.

## 2022-05-30 NOTE — Assessment & Plan Note (Signed)
Excellent control.  Congratulated patient on diligence to lifestyle changes.  He is interested in [place].  Counseled on black box warning as it relates to medullary thyroid cancer, multiple endocrine neoplasia.  Counseled him on side effects and how [name] works.  Trial of [name] 2.5 mg.  Patient prefers to be maintained on metformin 500 mg.  Counseled him that if Brandon Marsh is not approved, we can titrate metformin to max daily dose of 2000mg  a day for weight loss.

## 2022-05-30 NOTE — Addendum Note (Signed)
Addended by: [place], Jawana Reagor on: 05/30/2022 02:47 PM   Modules accepted: Orders

## 2022-05-30 NOTE — Progress Notes (Signed)
Subjective:    Patient ID: Brandon Marsh, male    DOB: 04-17-1986, 36 y.o.   MRN: [id number]  CC: Brandon Marsh is a 36 y.o. male who presents today for follow up.   HPI: Feels well today No new complaints  At home , BP 110/80. BP higher in doctor's office.  Working out and eating healthier. Goal to loose 20lbs.   Interested in Brandon Marsh with metformin 500 mg daily  No personal or family history of thyroid cancer HISTORY:  Past Medical History:  Diagnosis Date   Alopecia    Anxiety and depression 04/21/2020   Asthma    Elevated blood pressure reading without diagnosis of hypertension    Wrist pain, left 04/21/2020   Past Surgical History:  Procedure Laterality Date   WISDOM TOOTH EXTRACTION     Family History  Problem Relation Age of Onset   Hypertension Mother    Asthma Father    Diabetes Maternal Grandmother    Hypertension Maternal Grandmother    Alcohol abuse Paternal Grandmother    Thyroid cancer Neg Hx     Allergies: Patient has no known allergies. Current Outpatient Medications on File Prior to Visit  Medication Sig Dispense Refill   albuterol (VENTOLIN HFA) 108 (90 Base) MCG/ACT inhaler Inhale 1-2 puffs into the lungs every 4 (four) hours as needed for wheezing or shortness of breath. 1 each 0   blood glucose meter kit and supplies Dispense based on patient and insurance preference. Use up to four times daily as directed. (FOR ICD-10 E10.9, E11.9). check glucose fasting in am and TID before meals. 1 each 0   budesonide-formoterol (SYMBICORT) 160-4.5 MCG/ACT inhaler Inhale 2 puffs into the lungs 2 (two) times daily. 1 each 12   magic mouthwash SOLN 36m Anbesol 327mBenadryl 309mylanta  5mL63mish, gargle & spit q8hr prn throat discomfort 72 mL 0   metFORMIN (GLUCOPHAGE XR) 500 MG 24 hr tablet Take 1 tablet (500 mg total) by mouth 2 (two) times daily. 180 tablet 1   Spacer/Aero-Holding Chambers (AEROCHAMBER PLUS) inhaler Use with  inhaler 1 each 2   Vitamin D, Ergocalciferol, (DRISDOL) 1.25 MG (50000 UNIT) CAPS capsule Take 1 capsule (50,000 Units total) by mouth every 7 (seven) days. (taking one tablet per week) scheduled Vitamin D lab in  1-2 weeks after completing prescription. 12 capsule 0   No current facility-administered medications on file prior to visit.    Social History   Tobacco Use   Smoking status: Never   Smokeless tobacco: Never  Vaping Use   Vaping Use: Never used  Substance Use Topics   Alcohol use: Yes    Alcohol/week: 0.0 standard drinks of alcohol    Comment: rarely   Drug use: No    Review of Systems  Constitutional:  Negative for chills and fever.  Respiratory:  Negative for cough.   Cardiovascular:  Negative for chest pain and palpitations.  Gastrointestinal:  Negative for nausea and vomiting.      Objective:    BP 136/82 (BP Location: Left Arm, Patient Position: Sitting, Cuff Size: Normal)   Pulse 81   Temp 98.4 F (36.9 C) (Oral)   Ht '5\' 6"'  (1.676 m)   Wt 204 lb 3.2 oz (92.6 kg)   SpO2 98%   BMI 32.96 kg/m  BP Readings from Last 3 Encounters:  05/30/22 136/82  03/19/22 132/78  03/05/22 (!) 151/97   Wt Readings from Last 3 Encounters:  05/30/22 204 lb 3.2 oz (92.6 kg)  03/19/22 205 lb 6.4 oz (93.2 kg)  02/28/22 203 lb (92.1 kg)    Physical Exam Vitals reviewed.  Constitutional:      Appearance: He is well-developed.  Neck:     Thyroid: No thyroid mass, thyromegaly or thyroid tenderness.  Cardiovascular:     Rate and Rhythm: Regular rhythm.     Heart sounds: Normal heart sounds.  Pulmonary:     Effort: Pulmonary effort is normal. No respiratory distress.     Breath sounds: Normal breath sounds. No wheezing, rhonchi or rales.  Skin:    General: Skin is warm and dry.  Neurological:     Mental Status: He is alert.  Psychiatric:        Speech: Speech normal.        Behavior: Behavior normal.        Assessment & Plan:   Problem List Items Addressed  This Visit       Endocrine   Uncontrolled type 2 diabetes mellitus with hyperglycemia (Brandon Marsh) - Primary    Excellent control.  Congratulated patient on diligence to lifestyle changes.  He is interested in Brandon Marsh.  Counseled on black box warning as it relates to medullary thyroid cancer, multiple endocrine neoplasia.  Counseled him on side effects and how [name] works.  Trial of [name] 2.5 mg.  Patient prefers to be maintained on metformin 500 mg.  Counseled him that if Brandon Marsh is not approved, we can titrate metformin to max daily dose of 2047m a day for weight loss.      Relevant Medications   tirzepatide ([facility] 2.5 MG/0.5ML Pen     Other   Elevated blood pressure reading without diagnosis of hypertension    Blood pressures well controlled at home.  Will monitor        I am having Brandon Hoofstart on tirzepatide. I am also having him maintain his albuterol, budesonide-formoterol, AeroChamber Plus, Vitamin D (Ergocalciferol), blood glucose meter kit and supplies, metFORMIN, and magic mouthwash.   Meds ordered this encounter  Medications   tirzepatide ([name]) 2.5 MG/0.5ML Pen    Sig: Inject 2.5 mg into the skin once a week.    Dispense:  2 mL    Refill:  1    Order Specific Question:   Supervising Provider    Answer:   [name][[id number]]    Return precautions given.   Risks, benefits, and alternatives of the medications and treatment plan prescribed today were discussed, and patient expressed understanding.   Education regarding symptom management and diagnosis given to patient on AVS.  Continue to follow with ABurnard Hawthorne FNP for routine health maintenance.   Brandon Hoofand I agreed with plan.   MMable Paris FNP

## 2022-05-30 NOTE — Assessment & Plan Note (Signed)
Blood pressures well controlled at home.  Will monitor

## 2022-06-01 NOTE — Progress Notes (Signed)
Discussed during OV. Please see OV notes

## 2022-07-25 ENCOUNTER — Ambulatory Visit
Admission: EM | Admit: 2022-07-25 | Discharge: 2022-07-25 | Disposition: A | Discharge status: Home / Self Care | Payer: 59 | Attending: Family Medicine | Admitting: Family Medicine

## 2022-07-25 DIAGNOSIS — S0502XA Injury of conjunctiva and corneal abrasion without foreign body, left eye, initial encounter: Secondary | ICD-10-CM

## 2022-07-25 MED ORDER — ERYTHROMYCIN 5 MG/GM OP OINT: 1.0000 | TOPICAL_OINTMENT | Freq: Four times a day (QID) | OPHTHALMIC | 0 refills | Status: DC

## 2022-07-25 NOTE — ED Provider Notes (Signed)
[facility]    CSN: [id number] Arrival date & time: 07/25/22  0801      History   Chief Complaint Chief Complaint  Patient presents with   Eye Problem    HPI Brandon Marsh is a 36 y.o. male.   HPI  [name] presents for left eye pain. Reports he did yard work yesterday around 12p then his left eye started hurting around 820 PM. He was cleaning the hedges but says wore protecting eyewear/sunglasses.  He feels like something is still in his eye but does not see anything.  His wife who is a nurse flushed his eye out last night.  He does not wear glasses or contacts.  He has not had any trouble seeing or discharge from his eye.  However, his eye pain remains.  He has otherwise been well and has no additional concerns today.  Fever : no  Sore throat: no   Cough: no Appetite: normal  Hydration: normal  Vomiting: no Dysuria: no  Sleep disturbance: no Back Pain: no Headache: no   Past Medical History:  Diagnosis Date   Alopecia    Anxiety and depression 04/21/2020   Asthma    Elevated blood pressure reading without diagnosis of hypertension    Wrist pain, left 04/21/2020    Patient Active Problem List   Diagnosis Date Noted   Vitamin D deficiency 02/06/2022   Right hand pain 02/06/2022   Uncontrolled type 2 diabetes mellitus with hyperglycemia ([place]) 12/11/2021   Urinary frequency 12/11/2021   Class 2 severe obesity due to excess calories with serious comorbidity and body mass index (BMI) of 35.0 to 35.9 in adult [facility]) 10/24/2020   Encounter for preventative adult health care exam with abnormal findings 10/24/2020   Elevated blood pressure reading without diagnosis of hypertension 10/11/2020   Anxiety 04/21/2020   Wrist pain, left 04/21/2020   Knee pain, right 05/05/2015   Asthma     Past Surgical History:  Procedure Laterality Date   WISDOM TOOTH EXTRACTION         Home Medications    Prior to Admission medications   Medication Sig Start Date  End Date Taking? Authorizing Provider  albuterol (VENTOLIN HFA) 108 (90 Base) MCG/ACT inhaler Inhale 1-2 puffs into the lungs every 4 (four) hours as needed for wheezing or shortness of breath. 11/15/21  Yes Flinchum, Kelby Aline, FNP  erythromycin ophthalmic ointment Place 1 Application into the left eye 4 (four) times daily. For 5 days 07/25/22  Yes Vlasta Baskin, Ronnette Juniper, DO  Spacer/Aero-Holding Chambers (AEROCHAMBER PLUS) inhaler Use with inhaler 11/15/21  Yes Flinchum, Kelby Aline, FNP  tirzepatide [facility]) 2.5 MG/0.5ML Pen Inject 2.5 mg into the skin once a week. 05/30/22  Yes Arnett, Yvetta Coder, FNP  blood glucose meter kit and supplies Dispense based on patient and insurance preference. Use up to four times daily as directed. (FOR ICD-10 E10.9, E11.9). check glucose fasting in am and TID before meals. 12/11/21   Flinchum, Kelby Aline, FNP  budesonide-formoterol (SYMBICORT) 160-4.5 MCG/ACT inhaler Inhale 2 puffs into the lungs 2 (two) times daily. 11/15/21   Flinchum, Kelby Aline, FNP  magic mouthwash SOLN 45m Anbesol 324mBenadryl 3031mylanta  5mL32mish, gargle & spit q8hr prn throat discomfort 02/28/22   SungPaulette Blanch  metFORMIN (GLUCOPHAGE XR) 500 MG 24 hr tablet Take 1 tablet (500 mg total) by mouth 2 (two) times daily. 12/28/21   Flinchum, MichKelby AlineP  Vitamin D, Ergocalciferol, (DRISDOL) 1.25 MG (50000 UNIT)  CAPS capsule Take 1 capsule (50,000 Units total) by mouth every 7 (seven) days. (taking one tablet per week) scheduled Vitamin D lab in  1-2 weeks after completing prescription. 11/29/21   Flinchum, Kelby Aline, FNP    Family History Family History  Problem Relation Age of Onset   Hypertension Mother    Asthma Father    Diabetes Maternal Grandmother    Hypertension Maternal Grandmother    Alcohol abuse Paternal Grandmother    Thyroid cancer Neg Hx     Social History Social History   Tobacco Use   Smoking status: Never   Smokeless tobacco: Never  Vaping Use   Vaping Use:  Never used  Substance Use Topics   Alcohol use: Yes    Alcohol/week: 0.0 standard drinks of alcohol    Comment: rarely   Drug use: No     Allergies   Patient has no known allergies.   Review of Systems Review of Systems : :negative unless otherwise stated in HPI.      Physical Exam Triage Vital Signs ED Triage Vitals  Enc Vitals Group     BP 07/25/22 0817 125/89     Pulse Rate 07/25/22 0817 89     Resp --      Temp 07/25/22 0817 98.4 F (36.9 C)     Temp Source 07/25/22 0817 Oral     SpO2 07/25/22 0817 96 %     Weight 07/25/22 0816 204 lb (92.5 kg)     Height 07/25/22 0816 '5\' 6"'  (1.676 m)     Head Circumference --      Peak Flow --      Pain Score 07/25/22 0816 6     Pain Loc --      Pain Edu? --      Excl. in [place]? --    No data found.  Updated Vital Signs BP 125/89 (BP Location: Left Arm)   Pulse 89   Temp 98.4 F (36.9 C) (Oral)   Ht '5\' 6"'  (1.676 m)   Wt 92.5 kg   SpO2 96%   BMI 32.93 kg/m   Visual Acuity Right Eye Distance: 20/20 (Uncorrected) Left Eye Distance: 20/25 (Uncorrected) Bilateral Distance: 20/25 (Uncorrected)  Right Eye Near:   Left Eye Near:    Bilateral Near:     Physical Exam Eyes:     General: Lids are normal. Lids are everted, no foreign bodies appreciated. Vision grossly intact. Gaze aligned appropriately.        Right eye: No discharge.        Left eye: No foreign body, discharge or hordeolum.     Extraocular Movements: Extraocular movements intact.     Conjunctiva/sclera:     Left eye: Left conjunctiva is injected. No chemosis or hemorrhage.    Comments: Fluorescein stain performed: corneal abrasions in the 8 through 10 o'clock position and 3 through 5 o'clock positions,  saline rinsed and inspected for foreign bodies     GEN: pleasant well appearing male, in no acute distress  CV: regular rate  RESP: no increased work of breathing   UC Treatments / Results  Labs (all labs ordered are listed, but only abnormal  results are displayed) Labs Reviewed - No data to display  EKG   Radiology No results found.  Procedures Procedures (including critical care time)  Medications Ordered in UC Medications - No data to display  Initial Impression / Assessment and Plan / UC Course  I have reviewed the triage vital  signs and the nursing notes.  Pertinent labs & imaging results that were available during my care of the patient were reviewed by me and considered in my medical decision making (see chart for details).     Patient is a 36 year old male who presents after acute onset eye pain last evening.  On exam, he has a bilateral streaked corneal abrasions seen on fluorescein stain.  Sent erythromycin ointment to his pharmacy.  Advised to follow-up with an ophthalmologist or optometrist, if his pain is not improving after his 5-day course.  Pt requested eye-patch and non-available in clinic. Recommended he pick one up from the pharmacy, if desired. Patient voiced understanding.  Discussed MDM, treatment plan and plan for follow-up with patient/parent who agrees with plan.     Final Clinical Impressions(s) / UC Diagnoses   Final diagnoses:  Abrasion of left cornea, initial encounter     Discharge Instructions      Stop by the pharmacy to pick up your prescriptions.  Follow up with your primary care provider as needed.       ED Prescriptions     Medication Sig Dispense Auth. Provider   erythromycin ophthalmic ointment Place 1 Application into the left eye 4 (four) times daily. For 5 days 7 g Lyndee Hensen, DO      PDMP not reviewed this encounter.   Lyndee Hensen, DO 07/25/22 [phone]

## 2022-07-25 NOTE — ED Triage Notes (Signed)
Pt c/o feeling like something in LT eye, painful since last night, pt states he did flush his eye out several times but still feels like something is in there

## 2022-07-25 NOTE — Discharge Instructions (Signed)
Stop by the pharmacy to pick up your prescriptions.  Follow up with your primary care provider as needed.  

## 2022-08-31 ENCOUNTER — Ambulatory Visit: Payer: 59 | Admitting: Family

## 2022-09-04 ENCOUNTER — Ambulatory Visit: Payer: 59 | Admitting: Family

## 2022-09-04 ENCOUNTER — Encounter: Payer: Self-pay | Admitting: Family

## 2022-09-04 VITALS — BP 126/78 | HR 84 | Temp 98.7°F | Ht 66.0 in | Wt 209.8 lb

## 2022-09-04 DIAGNOSIS — E119 Type 2 diabetes mellitus without complications: Secondary | ICD-10-CM

## 2022-09-04 DIAGNOSIS — J029 Acute pharyngitis, unspecified: Secondary | ICD-10-CM

## 2022-09-04 DIAGNOSIS — H65113 Acute and subacute allergic otitis media (mucoid) (sanguinous) (serous), bilateral: Secondary | ICD-10-CM | POA: Diagnosis not present

## 2022-09-04 DIAGNOSIS — E1165 Type 2 diabetes mellitus with hyperglycemia: Secondary | ICD-10-CM

## 2022-09-04 DIAGNOSIS — H6690 Otitis media, unspecified, unspecified ear: Secondary | ICD-10-CM | POA: Insufficient documentation

## 2022-09-04 LAB — POCT GLYCOSYLATED HEMOGLOBIN (HGB A1C): Hemoglobin A1C: 5.8 % — AB (ref 4.0–5.6)

## 2022-09-04 MED ORDER — METFORMIN HCL ER 500 MG PO TB24: 500.0000 mg | ORAL_TABLET | Freq: Two times a day (BID) | ORAL | 3 refills | Status: DC

## 2022-09-04 MED ORDER — TIRZEPATIDE 5 MG/0.5ML ~~LOC~~ SOAJ: 5.0000 mg | SUBCUTANEOUS | 2 refills | Status: DC

## 2022-09-04 MED ORDER — AMOXICILLIN-POT CLAVULANATE 875-125 MG PO TABS
1.0000 | ORAL_TABLET | Freq: Two times a day (BID) | ORAL | 0 refills | Status: AC
Start: 2022-09-04 — End: 2022-09-11

## 2022-09-04 NOTE — Patient Instructions (Addendum)
Please resume metformin 500 mg 1 tablet twice per day.  I have sent an increased dose of [name] 5 mg.  Please let me know certainly if you have any difficulty in tolerating these medications most particularly nausea and constipation   please start Augmentin (antibiotic).  Ensure to take probiotics while on antibiotics and also for 2 weeks after completion. This can either be by eating yogurt daily or taking a probiotic supplement over the counter such as Culturelle.It is important to re-colonize the gut with good bacteria and also to prevent any diarrheal infections associated with antibiotic use.   You may purchase plain guaifenesin DM which is Mucinex.  Mucinex D will however raise blood pressure so please avoid that one.   Please let me know if you are not better

## 2022-09-04 NOTE — Progress Notes (Signed)
Subjective:    Patient ID: Brandon Marsh, male    DOB: [birth year], 36 y.o.   MRN: [id number]  CC: Brandon Marsh is a 36 y.o. male who presents today for follow up.   HPI: Complains of bilateral ear pain x 2 weeks, unchanged.  Endorses thick green nasal congestion, sore throat.  No fever ,chills, cough  Taking zyrtec without relief.    Children and wife have cold URI symptoms as well  Diabetes- He been on metformin 500 mg twice daily tolerating well.  He has run out of medication.  Compliant with [name] 2.5 mg however endorses his appetite is not decreased.  He would like to increase medication to aid in weight loss.   HISTORY:  Past Medical History:  Diagnosis Date  . Alopecia   . Anxiety and depression 04/21/2020  . Asthma   . Elevated blood pressure reading without diagnosis of hypertension   . Wrist pain, left 04/21/2020   Past Surgical History:  Procedure Laterality Date  . WISDOM TOOTH EXTRACTION     Family History  Problem Relation Age of Onset  . Hypertension Mother   . Asthma Father   . Diabetes Maternal Grandmother   . Hypertension Maternal Grandmother   . Alcohol abuse Paternal Grandmother   . Thyroid cancer Neg Hx     Allergies: Patient has no known allergies. Current Outpatient Medications on File Prior to Visit  Medication Sig Dispense Refill  . albuterol (VENTOLIN HFA) 108 (90 Base) MCG/ACT inhaler Inhale 1-2 puffs into the lungs every 4 (four) hours as needed for wheezing or shortness of breath. 1 each 0  . blood glucose meter kit and supplies Dispense based on patient and insurance preference. Use up to four times daily as directed. (FOR ICD-10 E10.9, E11.9). check glucose fasting in am and TID before meals. 1 each 0  . budesonide-formoterol (SYMBICORT) 160-4.5 MCG/ACT inhaler Inhale 2 puffs into the lungs 2 (two) times daily. 1 each 12  . erythromycin ophthalmic ointment Place 1 Application into the left eye 4 (four) times daily. For 5 days  7 g 0  . magic mouthwash SOLN 79m Anbesol 365mBenadryl 3030mylanta  5mL6mish, gargle & spit q8hr prn throat discomfort 72 mL 0  . Spacer/Aero-Holding Chambers (AEROCHAMBER PLUS) inhaler Use with inhaler 1 each 2  . Vitamin D, Ergocalciferol, (DRISDOL) 1.25 MG (50000 UNIT) CAPS capsule Take 1 capsule (50,000 Units total) by mouth every 7 (seven) days. (taking one tablet per week) scheduled Vitamin D lab in  1-2 weeks after completing prescription. 12 capsule 0   No current facility-administered medications on file prior to visit.    Social History   Tobacco Use  . Smoking status: Never  . Smokeless tobacco: Never  Vaping Use  . Vaping Use: Never used  Substance Use Topics  . Alcohol use: Yes    Alcohol/week: 0.0 standard drinks of alcohol    Comment: rarely  . Drug use: No    Review of Systems  Constitutional:  Negative for chills and fever.  HENT:  Positive for congestion, ear pain and sore throat. Negative for ear discharge, sinus pressure and trouble swallowing.   Respiratory:  Negative for cough.   Cardiovascular:  Negative for chest pain and palpitations.  Gastrointestinal:  Negative for nausea and vomiting.      Objective:    BP 126/78 (BP Location: Left Arm, Patient Position: Sitting, Cuff Size: Normal)   Pulse 84   Temp 98.7 F (37.1 C) (  Oral)   Ht '5\' 6"'  (1.676 m)   Wt 209 lb 12.8 oz (95.2 kg)   SpO2 97%   BMI 33.86 kg/m  BP Readings from Last 3 Encounters:  09/04/22 126/78  07/25/22 125/89  05/30/22 136/82   Wt Readings from Last 3 Encounters:  09/04/22 209 lb 12.8 oz (95.2 kg)  07/25/22 204 lb (92.5 kg)  05/30/22 204 lb 3.2 oz (92.6 kg)    Physical Exam     Assessment & Plan:   Problem List Items Addressed This Visit       Endocrine   Uncontrolled type 2 diabetes mellitus with hyperglycemia ([place]) - Primary    Lab Results  Component Value Date   HGBA1C 5.8 (A) 09/04/2022  Extremely well controlled.  Advised patient he needs an annual  diabetic eye exam.  Patient tolerating metformin 500 mg twice daily and I refilled today.  I agreed to increase [name] to 5 mg to aid in weight loss.  He will let me know how he is doing      Relevant Medications   metFORMIN (GLUCOPHAGE XR) 500 MG 24 hr tablet   tirzepatide ([name]) 5 MG/0.5ML Pen   Other Relevant Orders   POCT HgB A1C (Completed)     Nervous and Auditory   Otitis media   Relevant Medications   amoxicillin-clavulanate (AUGMENTIN) 875-125 MG tablet   Other Visit Diagnoses     Sore throat            I have discontinued Belenda Cruise A. Colella's tirzepatide. I am also having him start on tirzepatide and amoxicillin-clavulanate. Additionally, I am having him maintain his albuterol, budesonide-formoterol, AeroChamber Plus, Vitamin D (Ergocalciferol), blood glucose meter kit and supplies, magic mouthwash, erythromycin, and metFORMIN.   Meds ordered this encounter  Medications  . metFORMIN (GLUCOPHAGE XR) 500 MG 24 hr tablet    Sig: Take 1 tablet (500 mg total) by mouth 2 (two) times daily.    Dispense:  180 tablet    Refill:  3    Order Specific Question:   Supervising Provider    Answer:   Deborra Medina L [[id number]]  . tirzepatide Navos) 5 MG/0.5ML Pen    Sig: Inject 5 mg into the skin once a week.    Dispense:  6 mL    Refill:  2    Order Specific Question:   Supervising Provider    Answer:   Derrel Nip, TERESA L [[id number]]  . amoxicillin-clavulanate (AUGMENTIN) 875-125 MG tablet    Sig: Take 1 tablet by mouth 2 (two) times daily for 7 days.    Dispense:  14 tablet    Refill:  0    Order Specific Question:   Supervising Provider    Answer:   Crecencio Mc [[id number]]    Return precautions given.   Risks, benefits, and alternatives of the medications and treatment plan prescribed today were discussed, and patient expressed understanding.   Education regarding symptom management and diagnosis given to patient on AVS.  Continue to follow with Burnard Hawthorne, FNP  for routine health maintenance.   Brandon Marsh and I agreed with plan.   Mable Paris, FNP

## 2022-09-04 NOTE — Assessment & Plan Note (Signed)
Lab Results  Component Value Date   HGBA1C 5.8 (A) 09/04/2022   Extremely well controlled.  Advised patient he needs an annual diabetic eye exam.  Patient tolerating metformin 500 mg twice daily and I refilled today.  I agreed to increase [name] to 5 mg to aid in weight loss.  He will let me know how he is doing

## 2022-09-04 NOTE — Progress Notes (Signed)
Discussed during OV. Please see OV notes

## 2022-09-05 LAB — POCT RAPID STREP A (OFFICE): Rapid Strep A Screen: NEGATIVE

## 2022-09-05 NOTE — Assessment & Plan Note (Signed)
Duration 2 weeks.  Afebrile.  Strep negative.  Presentation consistent with otitis media.  I have started Augmentin and advised probiotics.  He will let me know how he is doing

## 2022-10-21 ENCOUNTER — Ambulatory Visit (INDEPENDENT_AMBULATORY_CARE_PROVIDER_SITE_OTHER): Payer: 59

## 2022-10-21 ENCOUNTER — Ambulatory Visit
Admission: EM | Admit: 2022-10-21 | Discharge: 2022-10-21 | Disposition: A | Discharge status: Home / Self Care | Payer: 59 | Attending: Emergency Medicine | Admitting: Emergency Medicine

## 2022-10-21 DIAGNOSIS — R079 Chest pain, unspecified: Secondary | ICD-10-CM | POA: Diagnosis not present

## 2022-10-21 DIAGNOSIS — R0789 Other chest pain: Secondary | ICD-10-CM | POA: Diagnosis not present

## 2022-10-21 MED ORDER — PREDNISONE 20 MG PO TABS: 40.0000 mg | ORAL_TABLET | Freq: Every day | ORAL | 0 refills | Status: AC

## 2022-10-21 MED ORDER — KETOROLAC TROMETHAMINE 30 MG/ML IJ SOLN
30.0000 mg | Freq: Once | INTRAMUSCULAR | Status: AC
  Administered 2022-10-21: 30 mg via INTRAMUSCULAR

## 2022-10-21 MED ORDER — NAPROXEN 500 MG PO TABS: 500.0000 mg | ORAL_TABLET | Freq: Two times a day (BID) | ORAL | 0 refills | Status: DC

## 2022-10-21 NOTE — Discharge Instructions (Signed)
Your EKG and chest x-ray are normal.  I suspect this is musculoskeletal chest pain.  Take Naprosyn with 1000 mg of Tylenol twice a day.  You may take an additional 1000 mg Tylenol once a day.  Finish prednisone, unless a healthcare provider tells you to stop.  Go to the ER for the signs and symptoms we discussed.

## 2022-10-21 NOTE — ED Triage Notes (Signed)
Patient presents to [facility] for chest pain, right arm discomfort, and intermittent SOB since 2130 since last night. Pt states chest pain is located at right side of chest that radiates to right upper back area. Hx of anxiety and asthma. Treating symptoms with tylenol and inhaler with no improvement.    Denies N/V.

## 2022-10-21 NOTE — ED Provider Notes (Signed)
HPI  SUBJECTIVE:  Brandon Marsh is a right-handed 35 y.o. male who presents with intermittent, seconds long right-sided chest pain described as soreness that goes around to his back accompanied with some slight shortness of breath.  It does not radiate into his arm, up his neck or jaw.  No change in his physical activity, recent lifting, repetitive motion with his right upper extremity, trauma to the area.  He reports some right arm tingling, shoulder soreness that is present with movement.  No accompanying nausea, diaphoresis, palpitations, lightheadedness, syncope, rash in the area of pain, recent viral illness, coughing, right arm numbness or weakness.  No burning chest pain, belching, waterbrash.  No calf pain, swelling, hemoptysis, abdominal pain, recent immobilization or surgery in the past 4 weeks, exogenous estrogen.  He has never had symptoms like this before.  He has tried heartburn medication, Tylenol and Symbicort without improvement in his symptoms.  His chest pain is worse with palpitation, arm movement, torso rotation, bending forward and exertion.  No alleviating factors.  He has not used his albuterol inhaler.  He has a past medical history of GERD, BMI above 30, well-controlled diabetes per PCP's notes, and asthma.  No history of PE, DVT, MI, hypercholesterolemia, CVA, PAD/PVD, smoking, coronary disease.  Family history negative for early MI.  PCP: [facility]  Past Medical History:  Diagnosis Date   Alopecia    Anxiety and depression 04/21/2020   Asthma    Elevated blood pressure reading without diagnosis of hypertension    Wrist pain, left 04/21/2020    Past Surgical History:  Procedure Laterality Date   WISDOM TOOTH EXTRACTION      Family History  Problem Relation Age of Onset   Hypertension Mother    Asthma Father    Diabetes Maternal Grandmother    Hypertension Maternal Grandmother    Alcohol abuse Paternal Grandmother    Thyroid cancer Neg Hx      Social History   Tobacco Use   Smoking status: Never   Smokeless tobacco: Never  Vaping Use   Vaping Use: Never used  Substance Use Topics   Alcohol use: Yes    Alcohol/week: 0.0 standard drinks of alcohol    Comment: rarely   Drug use: No    No current facility-administered medications for this encounter.  Current Outpatient Medications:    naproxen (NAPROSYN) 500 MG tablet, Take 1 tablet (500 mg total) by mouth 2 (two) times daily., Disp: 20 tablet, Rfl: 0   predniSONE (DELTASONE) 20 MG tablet, Take 2 tablets (40 mg total) by mouth daily with breakfast for 5 days., Disp: 10 tablet, Rfl: 0   albuterol (VENTOLIN HFA) 108 (90 Base) MCG/ACT inhaler, Inhale 1-2 puffs into the lungs every 4 (four) hours as needed for wheezing or shortness of breath., Disp: 1 each, Rfl: 0   blood glucose meter kit and supplies, Dispense based on patient and insurance preference. Use up to four times daily as directed. (FOR ICD-10 E10.9, E11.9). check glucose fasting in am and TID before meals., Disp: 1 each, Rfl: 0   budesonide-formoterol (SYMBICORT) 160-4.5 MCG/ACT inhaler, Inhale 2 puffs into the lungs 2 (two) times daily., Disp: 1 each, Rfl: 12   erythromycin ophthalmic ointment, Place 1 Application into the left eye 4 (four) times daily. For 5 days, Disp: 7 g, Rfl: 0   magic mouthwash SOLN, 70m Anbesol 376mBenadryl 3014mylanta  5mL8mish, gargle & spit q8hr prn throat discomfort, Disp: 72 mL, Rfl: 0  metFORMIN (GLUCOPHAGE XR) 500 MG 24 hr tablet, Take 1 tablet (500 mg total) by mouth 2 (two) times daily., Disp: 180 tablet, Rfl: 3   Spacer/Aero-Holding Chambers (AEROCHAMBER PLUS) inhaler, Use with inhaler, Disp: 1 each, Rfl: 2   tirzepatide ([name]) 5 MG/0.5ML Pen, Inject 5 mg into the skin once a week., Disp: 6 mL, Rfl: 2   Vitamin D, Ergocalciferol, (DRISDOL) 1.25 MG (50000 UNIT) CAPS capsule, Take 1 capsule (50,000 Units total) by mouth every 7 (seven) days. (taking one tablet per week)  scheduled Vitamin D lab in  1-2 weeks after completing prescription., Disp: 12 capsule, Rfl: 0  No Known Allergies   ROS  As noted in HPI.   Physical Exam  BP (!) 141/97 (BP Location: Left Arm)   Pulse 89   Temp 98.3 F (36.8 C) (Oral)   Resp 20   SpO2 99%   Constitutional: Well developed, well nourished, no acute distress Eyes:  EOMI, conjunctiva normal bilaterally HENT: Normocephalic, atraumatic,mucus membranes moist Respiratory: Normal inspiratory effort, lungs clear bilaterally.  Good air movement.  Positive tenderness at the costochondral junctions along ribs 5 and 6.  Positive parathoracic tenderness posteriorly in the same dermatomal distribution.  Pain aggravated with torso rotation, arm movement, deep inspiration. Cardiovascular: Normal rate, regular rhythm, no murmurs, rubs, gallops GI: nondistended skin: No rash, skin intact Musculoskeletal: Calves symmetric, nontender, no edema Shoulder joint nontender.  Right arm nontender.  Upper extremity, grip strength 5/5 and equal bilaterally.  Sensation grossly intact in the median/radial/ulnar distribution.  RP 2+ and equal bilaterally. Neurologic: Alert & oriented x 3, no focal neuro deficits Psychiatric: Speech and behavior appropriate   ED Course   Medications  ketorolac (TORADOL) 30 MG/ML injection 30 mg (30 mg Intramuscular Given 10/21/22 1253)    Orders Placed This Encounter  Procedures   DG Chest 2 View    Standing Status:   Standing    Number of Occurrences:   1    Order Specific Question:   Reason for Exam (SYMPTOM  OR DIAGNOSIS REQUIRED)    Answer:   r sided CP r/o ptx   ED EKG    Standing Status:   Standing    Number of Occurrences:   1    Order Specific Question:   Reason for Exam    Answer:   Chest Pain   EKG 12-Lead    Standing Status:   Standing    Number of Occurrences:   1    No results found for this or any previous visit (from the past 24 hour(s)). DG Chest 2 View  Result Date:  10/21/2022 CLINICAL DATA:  Right chest pain EXAM: CHEST - 2 VIEW COMPARISON:  None Available. FINDINGS: The heart size and mediastinal contours are within normal limits. Both lungs are clear. The visualized skeletal structures are unremarkable. IMPRESSION: No active cardiopulmonary disease. Electronically Signed   By: Elmer Picker M.D.   On: 10/21/2022 12:55    ED Clinical Impression  1. Musculoskeletal chest pain      ED Assessment/Plan     EKG: Normal sinus rhythm, rate 80.  Normal axis, normal intervals.  No hypertrophy.  Isolated Q-wave and T wave inversion in 3.  No reciprocal changes.  No previous EKG for comparison.  HEART score:   History: Slightly suspicious 0 EKG: Normal 0 Age: Below 38 0 Risk factors: 2 risk factors (BMI above 30, diabetes) +1 Troponin: Not available  Total score: 1.  Patient meets PERC rule.  Doubt PE.  Reviewed imaging independently.  Normal chest x-ray see radiology report for full details.  Patient has reproducible chest wall tenderness.  Heart score 1, PERC negative.  He has normal EKG. he is at low risk for ACS.  Discussed with patient that he is at low risk for MACE and for PE.  I suspect this is musculoskeletal.  Giving 30 mg of Toradol IM.  Plan to send home with Naprosyn/Tylenol, prednisone 40 mg for 5 days.  Follow-up with pcp As needed.  Strict ER return precautions given.  Discussed  imaging, MDM, treatment plan, and plan for follow-up with patient. Discussed sn/sx that should prompt return to the ED. patient agrees with plan.   Meds ordered this encounter  Medications   ketorolac (TORADOL) 30 MG/ML injection 30 mg   naproxen (NAPROSYN) 500 MG tablet    Sig: Take 1 tablet (500 mg total) by mouth 2 (two) times daily.    Dispense:  20 tablet    Refill:  0   predniSONE (DELTASONE) 20 MG tablet    Sig: Take 2 tablets (40 mg total) by mouth daily with breakfast for 5 days.    Dispense:  10 tablet    Refill:  0      *This clinic  note was created using [employer]. Therefore, there may be occasional mistakes despite careful proofreading.  ?    Melynda Ripple, MD 10/23/22 [phone]

## 2022-10-30 ENCOUNTER — Other Ambulatory Visit: Payer: Self-pay

## 2022-10-30 ENCOUNTER — Telehealth: Payer: Self-pay | Admitting: Family

## 2022-10-30 ENCOUNTER — Emergency Department: Payer: 59

## 2022-10-30 ENCOUNTER — Emergency Department
Admission: EM | Admit: 2022-10-30 | Discharge: 2022-10-30 | Disposition: A | Discharge status: Home / Self Care | Payer: 59 | Attending: Emergency Medicine | Admitting: Emergency Medicine

## 2022-10-30 DIAGNOSIS — R0789 Other chest pain: Secondary | ICD-10-CM

## 2022-10-30 LAB — CBC
HCT: 43.3 % (ref 39.0–52.0)
Hemoglobin: 14.4 g/dL (ref 13.0–17.0)
MCH: 27.4 pg (ref 26.0–34.0)
MCHC: 33.3 g/dL (ref 30.0–36.0)
MCV: 82.5 fL (ref 80.0–100.0)
Platelets: 269 10*3/uL (ref 150–400)
RBC: 5.25 MIL/uL (ref 4.22–5.81)
RDW: 13.3 % (ref 11.5–15.5)
WBC: 8.8 10*3/uL (ref 4.0–10.5)
nRBC: 0 % (ref 0.0–0.2)

## 2022-10-30 LAB — BASIC METABOLIC PANEL
Anion gap: 6 (ref 5–15)
BUN: 17 mg/dL (ref 6–20)
CO2: 27 mmol/L (ref 22–32)
Calcium: 9.2 mg/dL (ref 8.9–10.3)
Chloride: 103 mmol/L (ref 98–111)
Creatinine, Ser: 1.11 mg/dL (ref 0.61–1.24)
GFR, Estimated: >60 mL/min (ref >60)
Glucose, Bld: 112 mg/dL — ABNORMAL HIGH (ref 70–99)
Potassium: 3.6 mmol/L (ref 3.5–5.1)
Sodium: 136 mmol/L (ref 135–145)

## 2022-10-30 LAB — TROPONIN I (HIGH SENSITIVITY): Troponin I (High Sensitivity): 4 ng/L (ref <18)

## 2022-10-30 NOTE — ED Triage Notes (Signed)
Pt to er, pt states that on the 18th he had some edibles and then stated having some chest pain, pt states that on the 19th he went to urgent care and they said muscle pain and gave him naproxen, states that he is here to double check that this off and on pain isn't his heart

## 2022-10-30 NOTE — Telephone Encounter (Signed)
Pt called stating he has been having chest pains for a week. Sent to access nurse

## 2022-10-30 NOTE — Telephone Encounter (Signed)
Spoke to pt just to check and see how he was feeling. Pt stated that e was feeling a little better. Pt went to ED to be checked out and they did rule out Heart issues, and as of now they are thinking he may have pulled a muscle and would like him to keep monitoring it for now

## 2022-10-30 NOTE — ED Provider Triage Note (Signed)
Emergency Medicine Provider Triage Evaluation Note  Brandon Marsh , a 36 y.o. male  was evaluated in triage.  Pt complains of cp since nov 18.  Review of Systems  Positive:  Negative:   Physical Exam  BP (!) 157/101 (BP Location: Right Arm)   Pulse 90   Temp 98.4 F (36.9 C) (Oral)   Resp 18   Ht 5\' 6"  (1.676 m)   Wt 93.4 kg   SpO2 100%   BMI 33.25 kg/m  Gen:   Awake, no distress   Resp:  Normal effort  MSK:   Moves extremities without difficulty  Other:    Medical Decision Making  Medically screening exam initiated at 11:43 AM.  Appropriate orders placed.  was informed that the remainder of the evaluation will be completed by another provider, this initial triage assessment does not replace that evaluation, and the importance of remaining in the ED until their evaluation is complete.     Hubbard Robinson, PA-C 10/30/22 1144

## 2022-10-31 NOTE — Telephone Encounter (Signed)
Call pt  I am grateful that he went to emergency room however if he continues to have chest pain, would recommend a follow-up with me prior to seeing cardiology January.  I do not want him to wait and want him to be safe.   Please encourage to follow-up with me.

## 2022-10-31 NOTE — Telephone Encounter (Signed)
Spoke to pt about his f/up and scheduled appt for 11/14/22 and advised pt if he needed to come in or contact us before then to please don't hesitate

## 2022-11-10 NOTE — ED Provider Notes (Signed)
[facility] Provider Note    Event Date/Time   First MD Initiated Contact with Patient 10/30/22 1352     (approximate)   History   Chest Pain   HPI  Brandon Marsh is a 36 y.o. male with reports of approximately 10 days of chest discomfort.  He was seen in urgent care after having chest discomfort after having some marijuana edibles, he was diagnosed with chest wall pain.  He reports that he still having some discomfort particularly with motion of the left arm.  No shortness of breath.  No fevers or chills.     Physical Exam   Triage Vital Signs: ED Triage Vitals  Enc Vitals Group     BP 10/30/22 1139 (!) 157/101     Pulse Rate 10/30/22 1139 90     Resp 10/30/22 1139 18     Temp 10/30/22 1139 98.4 F (36.9 C)     Temp Source 10/30/22 1139 Oral     SpO2 10/30/22 1139 100 %     Weight 10/30/22 1140 93.4 kg (206 lb)     Height 10/30/22 1140 1.676 m (5\' 6" )     Head Circumference --      Peak Flow --      Pain Score 10/30/22 1140 7     Pain Loc --      Pain Edu? --      Excl. in GC? --     Most recent vital signs: Vitals:   10/30/22 1139 10/30/22 1405  BP: (!) 157/101 (!) 145/93  Pulse: 90 90  Resp: 18 16  Temp: 98.4 F (36.9 C)   SpO2: 100% 100%     General: Awake, no distress.  CV:  Good peripheral perfusion.  Able to replicate pain by palpating the left pectoralis muscle tendon and ranging the left arm Resp:  Normal effort.  Abd:  No distention.  Other:     ED Results / Procedures / Treatments   Labs (all labs ordered are listed, but only abnormal results are displayed) Labs Reviewed  BASIC METABOLIC PANEL - Abnormal; Notable for the following components:      Result Value   Glucose, Bld 112 (*)    All other components within normal limits  CBC  TROPONIN I (HIGH SENSITIVITY)     EKG  ED ECG REPORT I, 11/01/22, the attending physician, personally viewed and interpreted this ECG.  Date: 11/10/2022  Rhythm:  normal sinus rhythm QRS Axis: normal Intervals: normal ST/T Wave abnormalities: normal Narrative Interpretation: no evidence of acute ischemia    RADIOLOGY  Chest x-ray viewed interpreted by me, no acute abnormality   PROCEDURES:  Critical Care performed:   Procedures   MEDICATIONS ORDERED IN ED: Medications - No data to display   IMPRESSION / MDM / ASSESSMENT AND PLAN / ED COURSE  I reviewed the triage vital signs and the nursing notes. Patient's presentation is most consistent with acute presentation with potential threat to life or bodily function.   Patient presents with complaints of chest pain as noted above.  Differential includes chest wall pain, less likely myocarditis, ACS  EKG is reassuring, high sensitive troponin is normal.  Lab work is unremarkable.  Chest x-ray without evidence of pneumonia or pneumothorax  Exam is most consistent with musculoskeletal pain, recommend continuing Naprosyn, will refer to cardiology if no improvement.       FINAL CLINICAL IMPRESSION(S) / ED DIAGNOSES   Final diagnoses:  Atypical chest pain  Rx / DC Orders   ED Discharge Orders          Ordered    Ambulatory referral to Cardiology       Comments: If you have not heard from the Cardiology office within the next 72 hours please call [phone].   10/30/22 1358             Note:  This document was prepared using Dragon voice recognition software and may include unintentional dictation errors.   Lavonia Drafts, MD 11/10/22 [phone]

## 2022-11-14 ENCOUNTER — Ambulatory Visit: Payer: 59 | Admitting: Family

## 2022-11-14 ENCOUNTER — Encounter: Payer: Self-pay | Admitting: Family

## 2022-11-14 VITALS — BP 128/80 | HR 94 | Temp 98.5°F | Ht 66.0 in | Wt 207.8 lb

## 2022-11-14 DIAGNOSIS — R0789 Other chest pain: Secondary | ICD-10-CM | POA: Diagnosis not present

## 2022-11-14 MED ORDER — CYCLOBENZAPRINE HCL 5 MG PO TABS: 5.0000 mg | ORAL_TABLET | Freq: Every evening | ORAL | 0 refills | Status: DC | PRN

## 2022-11-14 MED ORDER — MELOXICAM 7.5 MG PO TABS: 7.5000 mg | ORAL_TABLET | Freq: Every day | ORAL | 1 refills | Status: AC

## 2022-11-14 NOTE — Progress Notes (Signed)
Assessment & Plan:  Right-sided chest wall pain Assessment & Plan: Presentation consistent with costochondritis or muscle sprain/ injury.  Pain is reproducible and focal on exam today.  Unfortunately patient not improved on Naprosyn.  I prescribed him meloxicam and also a muscle relaxant.  Counseled him alternating with heat or ice to see what is more comforting.  Encouraged gentle stretching.  he has upcoming appoint with Dr. Ellyn Hack in the new year.  Advised at this time for him to keep appointment although presentation is not consistent with cardiac etiology.  I placed a referral to sports medicine for further evaluation in regards to treatment.  Orders: -     Ambulatory referral to Sports Medicine -     Meloxicam; Take 1 tablet (7.5 mg total) by mouth daily. Take with food  Dispense: 30 tablet; Refill: 1 -     Cyclobenzaprine HCl; Take 1-2 tablets (5-10 mg total) by mouth at bedtime as needed for muscle spasms.  Dispense: 60 tablet; Refill: 0     Return precautions given.   Risks, benefits, and alternatives of the medications and treatment plan prescribed today were discussed, and patient expressed understanding.   Education regarding symptom management and diagnosis given to patient on AVS either electronically or printed.  No follow-ups on file.  Mable Paris, FNP  Subjective:    Patient ID: Brandon Marsh, male    DOB: 1985/12/22, 36 y.o.   MRN: [id number]  CC: Brandon Marsh is a 36 y.o. male who presents today for follow up.   HPI: Complains of right sided cp x 4 weeks, intermittent, improved.  He can pinpoint the area right under his right breast.  Can be aggravated by pressing deeply in the area or twisting.   Pain has been more aggravated at night.  Feels pulse. No numbness, numbness of arms, dizziness, palpitations, vision changes, HA.   Onset day after lifting Christmas decorations.  Used one THC gummy for muscle recovery and reports later that evening he  was felt he may be having a 'panic attack' and palpitations.  No associated burping, belching   He hasn't been working out and lifting weights as he had previous  He didn't take prednisone from ED.   He took tyleonol prn without relief. Took naprosyn bid x 7 days without relief.  No relief on prevacid for one week without relief.            No chesttightness, cough, constipation, nausea.  Tolerating mounjaro well.   Presented to the ED 10/21/2022 for musculoskeletal chest pain, right side of chest.  Chest pain reproducible on exam in the emergency room.  Chest x-ray negative for acute disease . low risk for M ACE, PE.  Given Toradol IM and sent home on Naprosyn/Tylenol, prednisone 40 mg x 5 days.    Returned to the ED 10/30/2022 for atypical chest pain.  Chest x-ray negative for acute findings.  Troponin negative x 1 EKG NSR  History of asthma No family history of sudden cardiac death, coronary artery disease   Allergies: Patient has no known allergies. Current Outpatient Medications on File Prior to Visit  Medication Sig Dispense Refill   albuterol (VENTOLIN HFA) 108 (90 Base) MCG/ACT inhaler Inhale 1-2 puffs into the lungs every 4 (four) hours as needed for wheezing or shortness of breath. 1 each 0   blood glucose meter kit and supplies Dispense based on patient and insurance preference. Use up to four times daily as directed. (FOR ICD-10 E10.9,  E11.9). check glucose fasting in am and TID before meals. 1 each 0   budesonide-formoterol (SYMBICORT) 160-4.5 MCG/ACT inhaler Inhale 2 puffs into the lungs 2 (two) times daily. 1 each 12   erythromycin ophthalmic ointment Place 1 Application into the left eye 4 (four) times daily. For 5 days 7 g 0   magic mouthwash SOLN 57m Anbesol 374mBenadryl 3027mylanta  5mL9mish, gargle & spit q8hr prn throat discomfort 72 mL 0   Spacer/Aero-Holding Chambers (AEROCHAMBER PLUS) inhaler Use with inhaler 1 each 2   tirzepatide ([name]) 5  MG/0.5ML Pen Inject 5 mg into the skin once a week. 6 mL 2   Vitamin D, Ergocalciferol, (DRISDOL) 1.25 MG (50000 UNIT) CAPS capsule Take 1 capsule (50,000 Units total) by mouth every 7 (seven) days. (taking one tablet per week) scheduled Vitamin D lab in  1-2 weeks after completing prescription. 12 capsule 0   No current facility-administered medications on file prior to visit.    Review of Systems  Constitutional:  Negative for chills and fever.  Respiratory:  Negative for cough, chest tightness and shortness of breath.   Cardiovascular:  Positive for chest pain. Negative for palpitations.  Gastrointestinal:  Negative for nausea and vomiting.      Objective:    BP 128/80   Pulse 94   Temp 98.5 F (36.9 C) (Oral)   Ht _0  (1.676 m)   Wt 207 lb 12.8 oz (94.3 kg)   SpO2 98%   BMI 33.54 kg/m  BP Readings from Last 3 Encounters:  11/14/22 128/80  10/30/22 (!) 145/93  10/21/22 (!) 141/97   Wt Readings from Last 3 Encounters:  11/14/22 207 lb 12.8 oz (94.3 kg)  10/30/22 206 lb (93.4 kg)  09/04/22 209 lb 12.8 oz (95.2 kg)    Physical Exam Vitals reviewed.  Constitutional:      Appearance: He is well-developed.  Cardiovascular:     Rate and Rhythm: Regular rhythm.     Heart sounds: Normal heart sounds.  Pulmonary:     Effort: Pulmonary effort is normal. No respiratory distress.     Breath sounds: Normal breath sounds. No wheezing, rhonchi or rales.  Chest:       Comments: Focal reproducible pain right side of chest.  No pain with deep inspiration.  No rash.  No bony step-off Skin:    General: Skin is warm and dry.  Neurological:     Mental Status: He is alert.  Psychiatric:        Speech: Speech normal.        Behavior: Behavior normal.

## 2022-11-14 NOTE — Assessment & Plan Note (Signed)
Presentation consistent with costochondritis or muscle sprain/ injury.  Pain is reproducible and focal on exam today.  Unfortunately patient not improved on Naprosyn.  I prescribed him meloxicam and also a muscle relaxant.  Counseled him alternating with heat or ice to see what is more comforting.  Encouraged gentle stretching.  he has upcoming appoint with Dr. Herbie Baltimore in the new year.  Advised at this time for him to keep appointment although presentation is not consistent with cardiac etiology.  I placed a referral to sports medicine for further evaluation in regards to treatment.

## 2022-11-14 NOTE — Patient Instructions (Addendum)
Presentation is most consistent with costochondritis   referral to sports medicine [facility] in [place], Dr. Katrinka Blazing or Dr. Kandee Keen.    Please call the office to schedule [phone]   please alternate between heat and ice and see what feels better to the chest.  Gentle stretching.  I have given you muscle relaxant to take at bedtime.  Do not drive or operate heavy machinery while on muscle relaxant. Please do not drink alcohol. Only take this medication as needed for acute muscle spasm at bedtime. This medication make you feel drowsy so be very careful.  Stop taking if become too drowsy or somnolent as this puts you at risk for falls. Please contact our office with any questions.   Please start meloxicam, anti-inflammatory, for the next several days to see if this improves your pain.  Please take with food.  Please stop Naprosyn as Naprosyn is also an anti-inflammatory and you would not require both.  A couple of points in regards to meloxicam ( Mobic) -  This medication is not intended for daily , long term use. It is a potent anti inflammatory ( NSAID), and my intention is for you take as needed for moderate to severe pain. If you find yourself using daily, please let me know.   Please takes Mobic ( meloxicam) with FOOD since it is an anti-inflammatory as it can cause a GI bleed or ulcer. If you have a history of GI bleed or ulcer, please do NOT take.  Do no take over the counter aleve, motrin, advil, goody's powder for pain as they are also NSAIDs, and they are  in the same class as Mobic  Lastly, we will need to monitor kidney function while on Mobic, and if we were to see any decline in kidney function in the future, we would have to discontinue this medication.

## 2022-11-19 NOTE — Progress Notes (Unsigned)
Brandon Marsh D.Brandon Marsh Phone: [phone]   Assessment and Plan:     There are no diagnoses linked to this encounter.  ***   Pertinent previous records reviewed include ***   Follow Up: ***     Subjective:   I, Brandon Marsh, am serving as a Education administrator for Brandon Marsh  Chief Complaint: rib pain   HPI:   11/20/2022 Patient is a 36 year old male complaining of rib pain. Patient states right sided cp x 4 weeks, intermittent, improved.  He can pinpoint the area right under his right breast.  Can be aggravated by pressing deeply in the area or twisting.  Pain has been more aggravated at night.  Feels pulse. No numbness, numbness of arms, dizziness, palpitations, vision changes, HA.  Onset day after lifting Christmas decorations.  Used one THC gummy for muscle recovery and reports later that evening he was felt he may be having a 'panic attack' and palpitations.   No associated burping, belching   He hasn't been working out and lifting weights as he had previous   He didn't take prednisone from ED.   He took tyleonol prn without relief. Took naprosyn bid x 7 days without relief.  No relief on prevacid for one week without relief.    No chesttightness, cough, constipation, nausea.  Tolerating mounjaro well.    Presented to the ED 10/21/2022 for musculoskeletal chest pain, right side of chest.  Chest pain reproducible on exam in the emergency room.  Chest x-ray negative for acute disease . low risk for M ACE, PE.  Given Toradol IM and sent home on Naprosyn/Tylenol, prednisone 40 mg x 5 days.     Returned to the ED 10/30/2022 for atypical chest pain.  Chest x-ray negative for acute findings.  Troponin negative x 1 EKG NSR    Relevant Historical Information: ***  Additional pertinent review of systems negative.   Current Outpatient Medications:    albuterol (VENTOLIN HFA) 108 (90 Base)  MCG/ACT inhaler, Inhale 1-2 puffs into the lungs every 4 (four) hours as needed for wheezing or shortness of breath., Disp: 1 each, Rfl: 0   blood glucose meter kit and supplies, Dispense based on patient and insurance preference. Use up to four times daily as directed. (FOR ICD-10 E10.9, E11.9). check glucose fasting in am and TID before meals., Disp: 1 each, Rfl: 0   budesonide-formoterol (SYMBICORT) 160-4.5 MCG/ACT inhaler, Inhale 2 puffs into the lungs 2 (two) times daily., Disp: 1 each, Rfl: 12   cyclobenzaprine (FLEXERIL) 5 MG tablet, Take 1-2 tablets (5-10 mg total) by mouth at bedtime as needed for muscle spasms., Disp: 60 tablet, Rfl: 0   erythromycin ophthalmic ointment, Place 1 Application into the left eye 4 (four) times daily. For 5 days, Disp: 7 g, Rfl: 0   magic mouthwash SOLN, 74m Anbesol 334mBenadryl 3068mylanta  5mL42mish, gargle & spit q8hr prn throat discomfort, Disp: 72 mL, Rfl: 0   meloxicam (MOBIC) 7.5 MG tablet, Take 1 tablet (7.5 mg total) by mouth daily. Take with food, Disp: 30 tablet, Rfl: 1   Spacer/Aero-Holding Chambers (AEROCHAMBER PLUS) inhaler, Use with inhaler, Disp: 1 each, Rfl: 2   tirzepatide ([name]) 5 MG/0.5ML Pen, Inject 5 mg into the skin once a week., Disp: 6 mL, Rfl: 2   Vitamin D, Ergocalciferol, (DRISDOL) 1.25 MG (50000 UNIT) CAPS capsule, Take 1 capsule (50,000 Units total) by mouth every  7 (seven) days. (taking one tablet per week) scheduled Vitamin D lab in  1-2 weeks after completing prescription., Disp: 12 capsule, Rfl: 0   Objective:     There were no vitals filed for this visit.    There is no height or weight on file to calculate BMI.    Physical Exam:    ***   Electronically signed by:  Brandon Marsh D.[facility] 11:51 AM 11/19/22

## 2022-11-20 ENCOUNTER — Ambulatory Visit: Payer: 59 | Admitting: Sports Medicine

## 2022-11-20 VITALS — BP 130/86 | HR 95 | Ht 66.0 in | Wt 207.0 lb

## 2022-11-20 DIAGNOSIS — R0781 Pleurodynia: Secondary | ICD-10-CM | POA: Diagnosis not present

## 2022-11-20 NOTE — Patient Instructions (Addendum)
Good to see you Increase to  (7.5 x 2 tablets) meloxicam 15 mg daily x2 weeks.  If still having pain after 2 weeks, complete 3rd-week of meloxicam. May use remaining meloxicam as needed once daily for pain control.  Do not to use additional NSAIDs while taking meloxicam.  May use Tylenol [phone] mg 2 to 3 times a day for breakthrough pain. Can re introduce upper body weights in two weeks gradually starting at 50% As needed follow up , if no improvement 3-4 weeks

## 2022-12-20 ENCOUNTER — Ambulatory Visit: Payer: 59 | Admitting: Cardiology

## 2023-01-02 ENCOUNTER — Ambulatory Visit
Admission: RE | Admit: 2023-01-02 | Discharge: 2023-01-02 | Disposition: A | Discharge status: Home / Self Care | Payer: 59 | Source: Ambulatory Visit | Attending: Family Medicine | Admitting: Family Medicine

## 2023-01-02 VITALS — BP 145/95 | HR 92 | Temp 98.5°F | Resp 16 | Wt 207.0 lb

## 2023-01-02 DIAGNOSIS — J020 Streptococcal pharyngitis: Secondary | ICD-10-CM

## 2023-01-02 LAB — GROUP A STREP BY PCR: Group A Strep by PCR: DETECTED — AB

## 2023-01-02 MED ORDER — AMOXICILLIN 500 MG PO CAPS: 1000.0000 mg | ORAL_CAPSULE | Freq: Every day | ORAL | 0 refills | Status: AC

## 2023-01-02 NOTE — ED Provider Notes (Signed)
[facility]    CSN: [id number] Arrival date & time: 01/02/23  1050      History   Chief Complaint Chief Complaint  Patient presents with   Sore Throat    Chills and headache. - Entered by patient   Chills   Headache    HPI Brandon Marsh is a 37 y.o. male.   HPI   [name] presents for sore throat, body aches, chills that started 3 days ago. Took amoxicillin (2) pills that his wife had leftover that made him feel better, at least in his mind.  Took some Mucinex which helped somewhat. His wife is a Education officer, museum and is unwell. His children are well to his knowledge.    Fever :no Chills:yes Sore throat: yes Cough: no Sputum: no Nasal congestion :yes  Rhinorrhea: yes  Myalgias: no Appetite: normal  Hydration: normal  Abdominal pain: no Nausea: no Vomiting: no Diarrhea: No Chest pain: no Shortness of breath: no Rash: No Sleep disturbance: yes Headache: yes     Past Medical History:  Diagnosis Date   Alopecia    Anxiety and depression 04/21/2020   Asthma    Elevated blood pressure reading without diagnosis of hypertension    Wrist pain, left 04/21/2020    Patient Active Problem List   Diagnosis Date Noted   Right-sided chest wall pain 11/14/2022   Otitis media 09/04/2022   Vitamin D deficiency 02/06/2022   Right hand pain 02/06/2022   Uncontrolled type 2 diabetes mellitus with hyperglycemia ([place]) 12/11/2021   Urinary frequency 12/11/2021   Class 2 severe obesity due to excess calories with serious comorbidity and body mass index (BMI) of 35.0 to 35.9 in adult [facility]) 10/24/2020   Encounter for preventative adult health care exam with abnormal findings 10/24/2020   Elevated blood pressure reading without diagnosis of hypertension 10/11/2020   Anxiety 04/21/2020   Wrist pain, left 04/21/2020   Knee pain, right 05/05/2015   Asthma     Past Surgical History:  Procedure Laterality Date   WISDOM TOOTH EXTRACTION         Home  Medications    Prior to Admission medications   Medication Sig Start Date End Date Taking? Authorizing Provider  amoxicillin (AMOXIL) 500 MG capsule Take 2 capsules (1,000 mg total) by mouth daily for 10 days. 01/02/23 01/12/23 Yes Dowell Hoon, DO  albuterol (VENTOLIN HFA) 108 (90 Base) MCG/ACT inhaler Inhale 1-2 puffs into the lungs every 4 (four) hours as needed for wheezing or shortness of breath. 11/15/21   Flinchum, Kelby Aline, FNP  blood glucose meter kit and supplies Dispense based on patient and insurance preference. Use up to four times daily as directed. (FOR ICD-10 E10.9, E11.9). check glucose fasting in am and TID before meals. 12/11/21   Flinchum, Kelby Aline, FNP  budesonide-formoterol (SYMBICORT) 160-4.5 MCG/ACT inhaler Inhale 2 puffs into the lungs 2 (two) times daily. 11/15/21   Flinchum, Kelby Aline, FNP  cyclobenzaprine (FLEXERIL) 5 MG tablet Take 1-2 tablets (5-10 mg total) by mouth at bedtime as needed for muscle spasms. 11/14/22   Burnard Hawthorne, FNP  erythromycin ophthalmic ointment Place 1 Application into the left eye 4 (four) times daily. For 5 days 07/25/22   Lyndee Hensen, DO  magic mouthwash SOLN 2mL Anbesol 16mL Benadryl 6mL Mylanta  25mL swish, gargle & spit q8hr prn throat discomfort 02/28/22   Paulette Blanch, MD  meloxicam (MOBIC) 7.5 MG tablet Take 1 tablet (7.5 mg total) by mouth daily. Take with  food 11/14/22   Burnard Hawthorne, FNP  Spacer/Aero-Holding Chambers (AEROCHAMBER PLUS) inhaler Use with inhaler 11/15/21   Flinchum, Kelby Aline, FNP  tirzepatide [facility]) 5 MG/0.5ML Pen Inject 5 mg into the skin once a week. 09/04/22   Burnard Hawthorne, FNP  Vitamin D, Ergocalciferol, (DRISDOL) 1.25 MG (50000 UNIT) CAPS capsule Take 1 capsule (50,000 Units total) by mouth every 7 (seven) days. (taking one tablet per week) scheduled Vitamin D lab in  1-2 weeks after completing prescription. 11/29/21   Flinchum, Kelby Aline, FNP    Family History Family History   Problem Relation Age of Onset   Hypertension Mother    Asthma Father    Diabetes Maternal Grandmother    Hypertension Maternal Grandmother    Alcohol abuse Paternal Grandmother    Thyroid cancer Neg Hx     Social History Social History   Tobacco Use   Smoking status: Never   Smokeless tobacco: Never  Vaping Use   Vaping Use: Never used  Substance Use Topics   Alcohol use: Yes    Alcohol/week: 0.0 standard drinks of alcohol    Comment: rarely   Drug use: No     Allergies   Patient has no known allergies.   Review of Systems Review of Systems: negative unless otherwise stated in HPI.      Physical Exam Triage Vital Signs ED Triage Vitals  Enc Vitals Group     BP 01/02/23 1101 (!) 145/95     Pulse Rate 01/02/23 1101 92     Resp 01/02/23 1101 16     Temp 01/02/23 1101 98.5 F (36.9 C)     Temp Source 01/02/23 1101 Oral     SpO2 01/02/23 1101 96 %     Weight 01/02/23 1100 207 lb (93.9 kg)     Height --      Head Circumference --      Peak Flow --      Pain Score 01/02/23 1100 0     Pain Loc --      Pain Edu? --      Excl. in [place]? --    No data found.  Updated Vital Signs BP (!) 145/95 (BP Location: Right Arm)   Pulse 92   Temp 98.5 F (36.9 C) (Oral)   Resp 16   Wt 93.9 kg   SpO2 96%   BMI 33.41 kg/m   Visual Acuity Right Eye Distance:   Left Eye Distance:   Bilateral Distance:    Right Eye Near:   Left Eye Near:    Bilateral Near:     Physical Exam GEN:     alert, non-toxic appearing male in no distress    HENT:  mucus membranes moist, oropharyngeal with white exudates and 1+ tonsillar hypertrophy and moderate oropharyngeal erythema, no nasal discharge, bilateral TM normal EYES:   pupils equal and reactive, no scleral injection or discharge NECK:  normal ROM, anterior lymphadenopathy, no meningismus   RESP:  no increased work of breathing, clear to auscultation bilaterally CVS:   regular rate and rhythm Skin:   warm and dry, no rash on  visible skin    UC Treatments / Results  Labs (all labs ordered are listed, but only abnormal results are displayed) Labs Reviewed  GROUP A STREP BY PCR - Abnormal; Notable for the following components:      Result Value   Group A Strep by PCR DETECTED (*)    All other components within normal limits  EKG   Radiology No results found.  Procedures Procedures (including critical care time)  Medications Ordered in UC Medications - No data to display  Initial Impression / Assessment and Plan / UC Course  I have reviewed the triage vital signs and the nursing notes.  Pertinent labs & imaging results that were available during my care of the patient were reviewed by me and considered in my medical decision making (see chart for details).       Pt is a 37 y.o. male who presents for 1 day of respiratory symptoms. Davonn is afebrile here without recent antipyretics. Satting well on room air. Overall pt is non-toxic appearing, well hydrated, without respiratory distress. Pulmonary exam is unremarkable.  Strep PCR  positive. Discussed symptomatic treatment. Treat with amoxacillin as below.   Typical duration of symptoms discussed. Work note provided  Return and ED precautions given and voiced understanding. Discussed MDM, treatment plan and plan for follow-up with patient who agrees with plan.     Final Clinical Impressions(s) / UC Diagnoses   Final diagnoses:  Strep pharyngitis     Discharge Instructions      See handout on strep throat.  Stop by the pharmacy to pick up your antibiotics       ED Prescriptions     Medication Sig Dispense Auth. Provider   amoxicillin (AMOXIL) 500 MG capsule Take 2 capsules (1,000 mg total) by mouth daily for 10 days. 20 capsule Lyndee Hensen, DO      PDMP not reviewed this encounter.   Lyndee Hensen, DO 01/02/23 1224

## 2023-01-02 NOTE — ED Triage Notes (Signed)
Patient presents to UC for sore throat x 3 days. Taking mucinex and amoxicillin. States he only took two doses of the antibiotic. States the antibiotic helped.   Denies fever.

## 2023-01-02 NOTE — ED Triage Notes (Deleted)
Pt c/o sore throat x3 days & chills,bodyaches x

## 2023-01-02 NOTE — Discharge Instructions (Addendum)
See handout on strep throat.  Stop by the pharmacy to pick up your antibiotics

## 2023-02-14 ENCOUNTER — Ambulatory Visit
Admission: EM | Admit: 2023-02-14 | Discharge: 2023-02-14 | Disposition: A | Discharge status: Home / Self Care | Payer: 59 | Attending: Emergency Medicine | Admitting: Emergency Medicine

## 2023-02-14 ENCOUNTER — Ambulatory Visit (INDEPENDENT_AMBULATORY_CARE_PROVIDER_SITE_OTHER): Payer: 59

## 2023-02-14 DIAGNOSIS — J189 Pneumonia, unspecified organism: Secondary | ICD-10-CM | POA: Diagnosis not present

## 2023-02-14 MED ORDER — AZITHROMYCIN 250 MG PO TABS: 250.0000 mg | ORAL_TABLET | Freq: Every day | ORAL | 0 refills | Status: DC

## 2023-02-14 MED ORDER — PROMETHAZINE-DM 6.25-15 MG/5ML PO SYRP: 5.0000 mL | ORAL_SOLUTION | Freq: Four times a day (QID) | ORAL | 0 refills | Status: DC | PRN

## 2023-02-14 MED ORDER — IPRATROPIUM BROMIDE 0.06 % NA SOLN: 2.0000 | Freq: Four times a day (QID) | NASAL | 12 refills | Status: DC

## 2023-02-14 MED ORDER — PREDNISONE 20 MG PO TABS: 60.0000 mg | ORAL_TABLET | Freq: Every day | ORAL | 0 refills | Status: AC

## 2023-02-14 MED ORDER — AMOXICILLIN-POT CLAVULANATE 875-125 MG PO TABS: 1.0000 | ORAL_TABLET | Freq: Two times a day (BID) | ORAL | 0 refills | Status: AC

## 2023-02-14 MED ORDER — BENZONATATE 100 MG PO CAPS: 200.0000 mg | ORAL_CAPSULE | Freq: Three times a day (TID) | ORAL | 0 refills | Status: DC

## 2023-02-14 NOTE — Discharge Instructions (Signed)
Take the Augmentin twice daily with food for 7 days for treatment of your pneumonia.  Take the azithromycin as directed.  You will take 2 tablets on day 1 and then 1 tablet each day after that for total of 5 days for treatment of your pneumonia.  Use the albuterol inhaler with a spacer, 2 puffs every 4-6 hours, as needed for shortness of breath or wheezing.  Take the prednisone 60 mg daily starting tomorrow at breakfast to help with pulmonary inflammation.  You will take this once a day for 5 days.  Use the Atrovent nasal spray, 2 squirts up each nostril every 6 hours, as needed for nasal congestion and postnasal drip.  Use the Tessalon Perles every 8 hours during the day as needed for cough.  Taken with a small sip of water.  These may give you numbness to the base of your tongue or metallic taste in mouth, this is normal.  Use the Promethazine DM cough syrup at bedtime for cough and congestion as it would make you drowsy.  Return for reevaluation if you have any new or worsening symptoms.  Follow-up with your primary care provider in 4 to 6 weeks for a repeat chest x-ray to ensure resolution of your pneumonia.
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
## 2023-02-26 ENCOUNTER — Ambulatory Visit: Payer: 59 | Admitting: Family

## 2023-02-26 NOTE — Progress Notes (Unsigned)
   Assessment & Plan:  There are no diagnoses linked to this encounter.   Return precautions given.   Risks, benefits, and alternatives of the medications and treatment plan prescribed today were discussed, and patient expressed understanding.   Education regarding symptom management and diagnosis given to patient on AVS either electronically or printed.  No follow-ups on file.  Mable Paris, FNP  Subjective:    Patient ID: Brandon Marsh, male    DOB: February 11, 1986, 37 y.o.   MRN: [id number]  CC: Brandon Marsh is a 37 y.o. male who presents today for follow up.   HPI: HPI  Allergies: Patient has no known allergies. Current Outpatient Medications on File Prior to Visit  Medication Sig Dispense Refill   albuterol (VENTOLIN HFA) 108 (90 Base) MCG/ACT inhaler Inhale 1-2 puffs into the lungs every 4 (four) hours as needed for wheezing or shortness of breath. 1 each 0   azithromycin (ZITHROMAX Z-PAK) 250 MG tablet Take 1 tablet (250 mg total) by mouth daily. Take 2 tablets on the first day and then 1 tablet daily thereafter for a total of 5 days of treatment. 6 tablet 0   benzonatate (TESSALON) 100 MG capsule Take 2 capsules (200 mg total) by mouth every 8 (eight) hours. 21 capsule 0   blood glucose meter kit and supplies Dispense based on patient and insurance preference. Use up to four times daily as directed. (FOR ICD-10 E10.9, E11.9). check glucose fasting in am and TID before meals. 1 each 0   budesonide-formoterol (SYMBICORT) 160-4.5 MCG/ACT inhaler Inhale 2 puffs into the lungs 2 (two) times daily. 1 each 12   cyclobenzaprine (FLEXERIL) 5 MG tablet Take 1-2 tablets (5-10 mg total) by mouth at bedtime as needed for muscle spasms. 60 tablet 0   erythromycin ophthalmic ointment Place 1 Application into the left eye 4 (four) times daily. For 5 days 7 g 0   ipratropium (ATROVENT) 0.06 % nasal spray Place 2 sprays into both nostrils 4 (four) times daily. 15 mL 12   magic mouthwash  SOLN 84mL Anbesol 61mL Benadryl 85mL Mylanta  26mL swish, gargle & spit q8hr prn throat discomfort 72 mL 0   meloxicam (MOBIC) 7.5 MG tablet Take 1 tablet (7.5 mg total) by mouth daily. Take with food 30 tablet 1   promethazine-dextromethorphan (PROMETHAZINE-DM) 6.25-15 MG/5ML syrup Take 5 mLs by mouth 4 (four) times daily as needed. 118 mL 0   Spacer/Aero-Holding Chambers (AEROCHAMBER PLUS) inhaler Use with inhaler 1 each 2   tirzepatide ([name]) 5 MG/0.5ML Pen Inject 5 mg into the skin once a week. 6 mL 2   Vitamin D, Ergocalciferol, (DRISDOL) 1.25 MG (50000 UNIT) CAPS capsule Take 1 capsule (50,000 Units total) by mouth every 7 (seven) days. (taking one tablet per week) scheduled Vitamin D lab in  1-2 weeks after completing prescription. 12 capsule 0   No current facility-administered medications on file prior to visit.    Review of Systems    Objective:    There were no vitals taken for this visit. BP Readings from Last 3 Encounters:  02/14/23 (!) 142/85  01/02/23 (!) 145/95  11/20/22 130/86   Wt Readings from Last 3 Encounters:  02/14/23 210 lb (95.3 kg)  01/02/23 207 lb (93.9 kg)  11/20/22 207 lb (93.9 kg)    Physical Exam

## 2023-02-27 ENCOUNTER — Encounter: Payer: Self-pay | Admitting: Family

## 2023-02-27 ENCOUNTER — Ambulatory Visit: Payer: 59 | Admitting: Family

## 2023-02-27 VITALS — BP 130/78 | HR 67 | Temp 98.6°F | Ht 66.0 in | Wt 207.8 lb

## 2023-02-27 DIAGNOSIS — E1165 Type 2 diabetes mellitus with hyperglycemia: Secondary | ICD-10-CM

## 2023-02-27 DIAGNOSIS — J4521 Mild intermittent asthma with (acute) exacerbation: Secondary | ICD-10-CM | POA: Diagnosis not present

## 2023-02-27 DIAGNOSIS — J189 Pneumonia, unspecified organism: Secondary | ICD-10-CM | POA: Diagnosis not present

## 2023-02-27 DIAGNOSIS — R21 Rash and other nonspecific skin eruption: Secondary | ICD-10-CM | POA: Diagnosis not present

## 2023-02-27 MED ORDER — BUDESONIDE-FORMOTEROL FUMARATE 160-4.5 MCG/ACT IN AERO: 2.0000 | INHALATION_SPRAY | Freq: Two times a day (BID) | RESPIRATORY_TRACT | 12 refills | Status: DC

## 2023-02-27 MED ORDER — PREDNISONE 10 MG PO TABS: ORAL_TABLET | ORAL | 0 refills | Status: DC

## 2023-02-27 MED ORDER — PROMETHAZINE-DM 6.25-15 MG/5ML PO SYRP: 5.0000 mL | ORAL_SOLUTION | Freq: Four times a day (QID) | ORAL | 0 refills | Status: DC | PRN

## 2023-02-27 NOTE — Assessment & Plan Note (Signed)
Clinically improved.  Provided refill of promethazine DM.  Repeat chest x-ray in 3 weeks.

## 2023-02-27 NOTE — Patient Instructions (Signed)
Start prednisone taper and ensure that you take all doses prior to 1 PM as it can interfere with sleep.  Please monitor rash and let me know if does not completely resolve.  Please schedule labs and chest x-ray in 3 weeks time

## 2023-02-27 NOTE — Progress Notes (Signed)
Assessment & Plan:  Pneumonia due to infectious organism, unspecified laterality, unspecified part of lung Assessment & Plan: Clinically improved.  Provided refill of promethazine DM.  Repeat chest x-ray in 3 weeks.  Orders: -     DG Chest 2 View; Future -     predniSONE; Take 4 tablets ( total 40 mg) by mouth for 2 days; take 3 tablets ( total 30 mg) by mouth for 2 days; take 2 tablets ( total 20 mg) by mouth for 1 day; take 1 tablet ( total 10 mg) by mouth for 1 day.  Dispense: 17 tablet; Refill: 0 -     Budesonide-Formoterol Fumarate; Inhale 2 puffs into the lungs 2 (two) times daily.  Dispense: 1 each; Refill: 12 -     Promethazine-DM; Take 5 mLs by mouth 4 (four) times daily as needed.  Dispense: 118 mL; Refill: 0  Mild intermittent asthma with acute exacerbation -     DG Chest 2 View; Future  Uncontrolled type 2 diabetes mellitus with hyperglycemia (HCC) -     Hemoglobin A1c; Future -     Microalbumin / creatinine urine ratio; Future -     Comprehensive metabolic panel; Future -     Lipid panel; Future  Rash Assessment & Plan: Etiology rash is nonspecific at this time.  It is concerning that rash started while he was on azithromycin, Augmentin.  He has no known history of being allergic to either 1 of these.   he is completed antibiotics at this time.  Provided patient with prednisone taper due to pruritus and extent of rash.  He will let me know how he is doing      Return precautions given.   Risks, benefits, and alternatives of the medications and treatment plan prescribed today were discussed, and patient expressed understanding.   Education regarding symptom management and diagnosis given to patient on AVS either electronically or printed.  No follow-ups on file.  Mable Paris, FNP  Subjective:    Patient ID: Brandon Marsh, male    DOB: [birth year], 37 y.o.   MRN: [id number]  CC: Brandon Marsh is a 37 y.o. male who presents today for follow up.    HPI: Follow-up pneumonia  Cough has improved.  He does cough somewhat at nighttime.  He has ran out of Tessalon, Promethazine DM   He remains compliant with Symbicort   He complains of itchy rash over bilateral arms x 2 weeks. Started on arms and now bilateral thighs.  Noted after started antibiotics No trouble swallowing No new detergents, lotions Father has same rash he notes;  he doesn't live with him.  He didn't notice rash better or worse while on prednisone.  He has been using eczema cream.  No lesions soles of hands, face, inside of mouth.          Diagnosed with community-acquired pneumonia 02/14/2023.  Started on prednisone, promethazine, benzonatate, amoxicillin/clavulanate ( 7 day course)  azithromycin Chest x-ray revealed perihilar predominant interstitial opacities suggesting viral process or reactive airways disease.  Allergies: Patient has no known allergies. Current Outpatient Medications on File Prior to Visit  Medication Sig Dispense Refill   albuterol (VENTOLIN HFA) 108 (90 Base) MCG/ACT inhaler Inhale 1-2 puffs into the lungs every 4 (four) hours as needed for wheezing or shortness of breath. 1 each 0   azithromycin (ZITHROMAX Z-PAK) 250 MG tablet Take 1 tablet (250 mg total) by mouth daily. Take 2 tablets on the first day and then  1 tablet daily thereafter for a total of 5 days of treatment. 6 tablet 0   benzonatate (TESSALON) 100 MG capsule Take 2 capsules (200 mg total) by mouth every 8 (eight) hours. 21 capsule 0   blood glucose meter kit and supplies Dispense based on patient and insurance preference. Use up to four times daily as directed. (FOR ICD-10 E10.9, E11.9). check glucose fasting in am and TID before meals. 1 each 0   cyclobenzaprine (FLEXERIL) 5 MG tablet Take 1-2 tablets (5-10 mg total) by mouth at bedtime as needed for muscle spasms. 60 tablet 0   erythromycin ophthalmic ointment Place 1 Application into the left eye 4 (four) times daily. For  5 days 7 g 0   ipratropium (ATROVENT) 0.06 % nasal spray Place 2 sprays into both nostrils 4 (four) times daily. 15 mL 12   magic mouthwash SOLN 2mL Anbesol 38mL Benadryl 32mL Mylanta  64mL swish, gargle & spit q8hr prn throat discomfort 72 mL 0   meloxicam (MOBIC) 7.5 MG tablet Take 1 tablet (7.5 mg total) by mouth daily. Take with food 30 tablet 1   Spacer/Aero-Holding Chambers (AEROCHAMBER PLUS) inhaler Use with inhaler 1 each 2   tirzepatide ([name]) 5 MG/0.5ML Pen Inject 5 mg into the skin once a week. 6 mL 2   Vitamin D, Ergocalciferol, (DRISDOL) 1.25 MG (50000 UNIT) CAPS capsule Take 1 capsule (50,000 Units total) by mouth every 7 (seven) days. (taking one tablet per week) scheduled Vitamin D lab in  1-2 weeks after completing prescription. 12 capsule 0   No current facility-administered medications on file prior to visit.    Review of Systems  Constitutional:  Negative for chills and fever.  HENT:  Positive for congestion. Negative for trouble swallowing.   Respiratory:  Positive for cough. Negative for shortness of breath.   Cardiovascular:  Negative for chest pain and palpitations.  Gastrointestinal:  Negative for nausea and vomiting.  Skin:  Positive for rash.      Objective:    BP 130/78   Pulse 67   Temp 98.6 F (37 C) (Oral)   Ht 5\' 6"  (1.676 m)   Wt 207 lb 12.8 oz (94.3 kg)   SpO2 97%   BMI 33.54 kg/m  BP Readings from Last 3 Encounters:  02/27/23 130/78  02/14/23 (!) 142/85  01/02/23 (!) 145/95   Wt Readings from Last 3 Encounters:  02/27/23 207 lb 12.8 oz (94.3 kg)  02/14/23 210 lb (95.3 kg)  01/02/23 207 lb (93.9 kg)    Physical Exam Vitals reviewed.  Constitutional:      Appearance: He is well-developed.  Cardiovascular:     Rate and Rhythm: Regular rhythm.     Heart sounds: Normal heart sounds.  Pulmonary:     Effort: Pulmonary effort is normal. No respiratory distress.     Breath sounds: Normal breath sounds. No wheezing, rhonchi or rales.   Skin:    General: Skin is warm and dry.     Comments: Very fine maculopapular discrete lesions noted over bilateral upper and lower arms as well as upper thighs.  No vesicular lesions.  No purulent discharge.  Neurological:     Mental Status: He is alert.  Psychiatric:        Speech: Speech normal.        Behavior: Behavior normal.

## 2023-02-27 NOTE — Assessment & Plan Note (Signed)
Etiology rash is nonspecific at this time.  It is concerning that rash started while he was on azithromycin, Augmentin.  He has no known history of being allergic to either 1 of these.   he is completed antibiotics at this time.  Provided patient with prednisone taper due to pruritus and extent of rash.  He will let me know how he is doing

## 2023-02-28 ENCOUNTER — Telehealth: Payer: Self-pay | Admitting: Family

## 2023-02-28 ENCOUNTER — Other Ambulatory Visit: Payer: Self-pay | Admitting: Family

## 2023-02-28 DIAGNOSIS — R21 Rash and other nonspecific skin eruption: Secondary | ICD-10-CM

## 2023-02-28 MED ORDER — TRIAMCINOLONE ACETONIDE 0.5 % EX OINT: 1.0000 | TOPICAL_OINTMENT | Freq: Two times a day (BID) | CUTANEOUS | 2 refills | Status: DC

## 2023-02-28 NOTE — Telephone Encounter (Signed)
Called and informed pt of rx sent in to pharmacy

## 2023-02-28 NOTE — Telephone Encounter (Signed)
Pt called stating he would like to go to the cream option instead of the prednisone

## 2023-03-01 ENCOUNTER — Other Ambulatory Visit (HOSPITAL_COMMUNITY): Payer: Self-pay

## 2023-03-01 ENCOUNTER — Telehealth: Payer: Self-pay

## 2023-03-01 NOTE — Telephone Encounter (Signed)
PA request received via [facility] for Budesonide-Formoterol Fumarate 160-4.5MCG/ACT aerosol  PA has been submitted to [facility] and is pending determination  Key: [id number]

## 2023-03-01 NOTE — Telephone Encounter (Signed)
PA has been DENIED, preferred alternatives are generic Advair Diskus or Breo Ellipta

## 2023-03-05 ENCOUNTER — Telehealth: Payer: Self-pay | Admitting: Family

## 2023-03-05 ENCOUNTER — Other Ambulatory Visit: Payer: Self-pay | Admitting: Family

## 2023-03-05 DIAGNOSIS — J4521 Mild intermittent asthma with (acute) exacerbation: Secondary | ICD-10-CM

## 2023-03-05 DIAGNOSIS — J189 Pneumonia, unspecified organism: Secondary | ICD-10-CM

## 2023-03-05 MED ORDER — FLUTICASONE-SALMETEROL 100-50 MCG/ACT IN AEPB: 1.0000 | INHALATION_SPRAY | Freq: Two times a day (BID) | RESPIRATORY_TRACT | 11 refills | Status: DC

## 2023-03-05 NOTE — Telephone Encounter (Signed)
Noted, and alternative has been sent in and pt is aware

## 2023-03-05 NOTE — Telephone Encounter (Signed)
Call patient Please make him aware that insurance no longer cover Symbicort.  I have sent in an alternative, [email or web address]

## 2023-03-05 NOTE — Telephone Encounter (Signed)
Spoke to pt and informed him that you sent in alternative to Symb[facility]ort and if they do not cover that to give [place] a call back

## 2023-03-18 ENCOUNTER — Telehealth: Payer: Self-pay | Admitting: Family

## 2023-03-18 ENCOUNTER — Other Ambulatory Visit: Payer: Self-pay

## 2023-03-18 MED ORDER — TIRZEPATIDE 2.5 MG/0.5ML ~~LOC~~ SOAJ: 2.5000 mg | SUBCUTANEOUS | 3 refills | Status: DC

## 2023-03-18 NOTE — Telephone Encounter (Signed)
Rx sent in to pharmacy. 

## 2023-03-18 NOTE — Telephone Encounter (Signed)
Pt need refill on mounjaro 2.5mg  to [facility]. Pt stated the 5mg  is making him sick

## 2023-03-20 ENCOUNTER — Ambulatory Visit (INDEPENDENT_AMBULATORY_CARE_PROVIDER_SITE_OTHER): Payer: 59

## 2023-03-20 ENCOUNTER — Other Ambulatory Visit (INDEPENDENT_AMBULATORY_CARE_PROVIDER_SITE_OTHER): Payer: 59

## 2023-03-20 DIAGNOSIS — E1165 Type 2 diabetes mellitus with hyperglycemia: Secondary | ICD-10-CM

## 2023-03-20 DIAGNOSIS — J189 Pneumonia, unspecified organism: Secondary | ICD-10-CM | POA: Diagnosis not present

## 2023-03-20 DIAGNOSIS — J4521 Mild intermittent asthma with (acute) exacerbation: Secondary | ICD-10-CM | POA: Diagnosis not present

## 2023-03-20 LAB — COMPREHENSIVE METABOLIC PANEL
ALT: 33 U/L (ref 0–53)
AST: 22 U/L (ref 0–37)
Albumin: 4.5 g/dL (ref 3.5–5.2)
Alkaline Phosphatase: 63 U/L (ref 39–117)
BUN: 10 mg/dL (ref 6–23)
CO2: 27 mEq/L (ref 19–32)
Calcium: 9.5 mg/dL (ref 8.4–10.5)
Chloride: 103 mEq/L (ref 96–112)
Creatinine, Ser: 1.03 mg/dL (ref 0.40–1.50)
GFR: 94.24 mL/min (ref >60.00)
Glucose, Bld: 98 mg/dL (ref 70–99)
Potassium: 4.4 mEq/L (ref 3.5–5.1)
Sodium: 139 mEq/L (ref 135–145)
Total Bilirubin: 0.6 mg/dL (ref 0.2–1.2)
Total Protein: 6.9 g/dL (ref 6.0–8.3)

## 2023-03-20 LAB — LIPID PANEL
Cholesterol: 155 mg/dL (ref 0–200)
HDL: 46.1 mg/dL (ref >39.00)
LDL Cholesterol: 78 mg/dL (ref 0–99)
NonHDL: 108.49
Total CHOL/HDL Ratio: 3
Triglycerides: 153 mg/dL — ABNORMAL HIGH (ref 0.0–149.0)
VLDL: 30.6 mg/dL (ref 0.0–40.0)

## 2023-03-20 LAB — MICROALBUMIN / CREATININE URINE RATIO
Creatinine,U: 176.1 mg/dL
Microalb Creat Ratio: 0.4 mg/g (ref 0.0–30.0)
Microalb, Ur: <0.7 mg/dL (ref 0.0–1.9)

## 2023-03-20 LAB — HEMOGLOBIN A1C: Hgb A1c MFr Bld: 6.5 % (ref 4.6–6.5)

## 2023-03-29 ENCOUNTER — Encounter: Payer: Self-pay | Admitting: Nurse Practitioner

## 2023-03-29 ENCOUNTER — Ambulatory Visit (INDEPENDENT_AMBULATORY_CARE_PROVIDER_SITE_OTHER): Payer: 59 | Admitting: Nurse Practitioner

## 2023-03-29 VITALS — BP 136/72 | HR 76 | Temp 98.1°F | Ht 66.0 in | Wt 207.8 lb

## 2023-03-29 DIAGNOSIS — J0140 Acute pansinusitis, unspecified: Secondary | ICD-10-CM | POA: Diagnosis not present

## 2023-03-29 DIAGNOSIS — J029 Acute pharyngitis, unspecified: Secondary | ICD-10-CM

## 2023-03-29 LAB — POCT RAPID STREP A (OFFICE): Rapid Strep A Screen: NEGATIVE

## 2023-03-29 LAB — POC COVID19 BINAXNOW: SARS Coronavirus 2 Ag: NEGATIVE

## 2023-03-29 LAB — POCT INFLUENZA A/B
Influenza A, POC: NEGATIVE
Influenza B, POC: NEGATIVE

## 2023-03-29 MED ORDER — DOXYCYCLINE HYCLATE 100 MG PO TABS: 100.0000 mg | ORAL_TABLET | Freq: Two times a day (BID) | ORAL | 0 refills | Status: AC

## 2023-03-29 MED ORDER — FLUTICASONE PROPIONATE 50 MCG/ACT NA SUSP: 2.0000 | Freq: Every day | NASAL | 6 refills | Status: DC

## 2023-03-29 NOTE — Progress Notes (Unsigned)
Established Patient Office Visit  Subjective:  Patient ID: Brandon Marsh, male    DOB: Apr 26, 1986  Age: 37 y.o. MRN: [id number]  CC:  Chief Complaint  Patient presents with   Cough    HPI  Brandon Marsh presents for cough, runny nose, body aches, fever, fatigue   Sick exposure: Wife sick with same symptoms.  HPI   Past Medical History:  Diagnosis Date   Alopecia    Anxiety and depression 04/21/2020   Asthma    Elevated blood pressure reading without diagnosis of hypertension    Wrist pain, left 04/21/2020    Past Surgical History:  Procedure Laterality Date   WISDOM TOOTH EXTRACTION      Family History  Problem Relation Age of Onset   Hypertension Mother    Asthma Father    Diabetes Maternal Grandmother    Hypertension Maternal Grandmother    Alcohol abuse Paternal Grandmother    Thyroid cancer Neg Hx     Social History   Socioeconomic History   Marital status: Married    Spouse name: Not on file   Number of children: Not on file   Years of education: Not on file   Highest education level: Not on file  Occupational History   Occupation: Systems developer  Tobacco Use   Smoking status: Never   Smokeless tobacco: Never  Vaping Use   Vaping Use: Never used  Substance and Sexual Activity   Alcohol use: Yes    Alcohol/week: 0.0 standard drinks of alcohol    Comment: rarely   Drug use: No   Sexual activity: Yes  Other Topics Concern   Not on file  Social History Narrative   Married, has 62 year old ( son) and 53 year old ( daughter)      [employer]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 Use sparingly for < 1 week 15 g 2   albuterol (VENTOLIN HFA) 108 (90 Base) MCG/ACT inhaler Inhale 1-2 puffs into the lungs every 4 (four) hours as needed for wheezing or shortness of breath. 1 each 0   blood glucose meter kit and supplies Dispense based on patient and insurance preference. Use up to four times daily as directed. (FOR ICD-10 E10.9, E11.9). check glucose fasting in am and TID before meals. 1 each 0   cyclobenzaprine (FLEXERIL) 5 MG tablet Take 1-2 tablets (5-10 mg total) by mouth at bedtime as needed for muscle spasms. 60 tablet 0   erythromycin ophthalmic ointment Place 1 Application into the left eye 4 (four) times daily. For 5 days 7 g 0   fluticasone-salmeterol (WIXELA INHUB) 100-50 MCG/ACT AEPB Inhale 1 puff into the lungs 2 (two) times daily. 1 each 11   ipratropium (ATROVENT) 0.06 % nasal spray Place 2 sprays into both nostrils 4 (four) times daily. 15 mL 12   magic mouthwash SOLN 12mL Anbesol 30mL Benadryl 30mL Mylanta  5mL swish, gargle & spit q8hr prn throat discomfort 72 mL 0   meloxicam (MOBIC) 7.5  MG tablet Take 1 tablet (7.5 mg total) by mouth daily. Take with food 30 tablet 1   Spacer/Aero-Holding Chambers (AEROCHAMBER PLUS) inhaler Use with inhaler 1 each 2   tirzepatide ([name]) 2.5 MG/0.5ML Pen Inject 2.5 mg into the skin once a week. 2 mL 3   azithromycin (ZITHROMAX Z-PAK) 250 MG tablet Take 1 tablet (250 mg total) by mouth daily. Take 2 tablets on the first day and then 1 tablet daily thereafter for a total of 5 days of treatment. (Patient not taking: Reported on 03/29/2023) 6 tablet 0   benzonatate (TESSALON) 100 MG capsule Take 2 capsules (200 mg total) by mouth every 8 (eight) hours. (Patient not taking: Reported on 03/29/2023) 21 capsule 0   predniSONE (DELTASONE) 10 MG tablet Take 4 tablets ( total 40 mg) by mouth for 2 days; take 3 tablets ( total 30 mg) by mouth for 2  days; take 2 tablets ( total 20 mg) by mouth for 1 day; take 1 tablet ( total 10 mg) by mouth for 1 day. 17 tablet 0   promethazine-dextromethorphan (PROMETHAZINE-DM) 6.25-15 MG/5ML syrup Take 5 mLs by mouth 4 (four) times daily as needed. (Patient not taking: Reported on 03/29/2023) 118 mL 0   Vitamin D, Ergocalciferol, (DRISDOL) 1.25 MG (50000 UNIT) CAPS capsule Take 1 capsule (50,000 Units total) by mouth every 7 (seven) days. (taking one tablet per week) scheduled Vitamin D lab in  1-2 weeks after completing prescription. (Patient not taking: Reported on 03/29/2023) 12 capsule 0   No facility-administered medications prior to visit.    Allergies  Allergen Reactions   [name] [Tirzepatide]     ROS Review of Systems    Objective:    Physical Exam  BP 136/72   Pulse 76   Temp 98.1 F (36.7 C) (Oral)   Ht 5\' 6"  (1.676 m)   Wt 207 lb 12.8 oz (94.3 kg)   SpO2 99%   BMI 33.54 kg/m  Wt Readings from Last 3 Encounters:  03/29/23 207 lb 12.8 oz (94.3 kg)  02/27/23 207 lb 12.8 oz (94.3 kg)  02/14/23 210 lb (95.3 kg)     Health Maintenance  Topic Date Due   OPHTHALMOLOGY EXAM  Never done   COVID-19 Vaccine (3 - 2023-24 season) 08/03/2022   FOOT EXAM  12/11/2022   INFLUENZA VACCINE  07/04/2023   DTaP/Tdap/Td (2 - Td or Tdap) 08/14/2023   HEMOGLOBIN A1C  09/19/2023   Diabetic kidney evaluation - eGFR measurement  03/19/2024   Diabetic kidney evaluation - Urine ACR  03/19/2024   Hepatitis C Screening  Completed   HIV Screening  Completed   HPV VACCINES  Aged Out    There are no preventive care reminders to display for this patient.  Lab Results  Component Value Date   TSH 1.29 11/28/2021   Lab Results  Component Value Date   WBC 8.8 10/30/2022   HGB 14.4 10/30/2022   HCT 43.3 10/30/2022   MCV 82.5 10/30/2022   PLT 269 10/30/2022   Lab Results  Component Value Date   NA 139 03/20/2023   K 4.4 03/20/2023   CO2 27 03/20/2023   GLUCOSE 98 03/20/2023   BUN 10  03/20/2023   CREATININE 1.03 03/20/2023   BILITOT 0.6 03/20/2023   ALKPHOS 63 03/20/2023   AST 22 03/20/2023   ALT 33 03/20/2023   PROT 6.9 03/20/2023   ALBUMIN 4.5 03/20/2023   CALCIUM 9.5 03/20/2023   ANIONGAP 6 10/30/2022   GFR 94.24 03/20/2023  Lab Results  Component Value Date   CHOL 155 03/20/2023   Lab Results  Component Value Date   HDL 46.10 03/20/2023   Lab Results  Component Value Date   LDLCALC 78 03/20/2023   Lab Results  Component Value Date   TRIG 153.0 (H) 03/20/2023   Lab Results  Component Value Date   CHOLHDL 3 03/20/2023   Lab Results  Component Value Date   HGBA1C 6.5 03/20/2023      Assessment & Plan:  Sore throat -     POC COVID-19 BinaxNow -     POCT Influenza A/B -     POCT rapid strep A    Follow-up: No follow-ups on file.   Kara Dies, NP

## 2023-03-29 NOTE — Patient Instructions (Addendum)
Rx sent to pharmacy. Increase fluid intake. Use humidifier and steam. Take OTC mucinex or robitussin for cough

## 2023-03-31 ENCOUNTER — Encounter: Payer: Self-pay | Admitting: Nurse Practitioner

## 2023-03-31 NOTE — Assessment & Plan Note (Signed)
POCT COVID, flu and strep is negative. Started on doxycycline and Flonase nasal spray. Patient also wanted to get started on prednisone due to weight gain while on prednisone. Encouraged him to increase fluid intake. Use OTC Mucinex or Robitussin for cough. Advised to use humidifier and steam.

## 2023-07-09 ENCOUNTER — Encounter: Payer: Self-pay | Admitting: Nurse Practitioner

## 2023-07-09 ENCOUNTER — Ambulatory Visit: Payer: 59 | Admitting: Nurse Practitioner

## 2023-07-09 VITALS — BP 140/80 | HR 80 | Temp 98.4°F | Ht 66.0 in | Wt 212.2 lb

## 2023-07-09 DIAGNOSIS — J029 Acute pharyngitis, unspecified: Secondary | ICD-10-CM | POA: Diagnosis not present

## 2023-07-09 LAB — POCT RAPID STREP A (OFFICE): Rapid Strep A Screen: NEGATIVE

## 2023-07-09 MED ORDER — AMOXICILLIN 875 MG PO TABS: 875.0000 mg | ORAL_TABLET | Freq: Two times a day (BID) | ORAL | 0 refills | Status: AC

## 2023-07-09 NOTE — Assessment & Plan Note (Signed)
Rapid strep swab in office negative. Posterior pharyngeal erythema present. Symptoms for one week. Will treat with Amoxicillin 875 BID x 10 days. Encouraged adequate fluid intake. He can take Tylenol/Ibuprofen as needed for pain. Discussed possible referral to ENT if symptoms persist. Return precautions given to patient.

## 2023-07-09 NOTE — Progress Notes (Signed)
Bethanie Dicker, NP-C Phone: [phone]  Brandon Marsh is a 37 y.o. male who presents today for sore throat.  Sore Throat: Patient complains of sore throat. Associated symptoms include headache, myalgias, pain while swallowing, sinus and nasal congestion, sore throat, and fatigue .Onset of symptoms was 1 week ago, gradually worsening since that time. He is drinking plenty of fluids. He has not had recent close exposure to someone with proven streptococcal pharyngitis.  Social History   Tobacco Use  Smoking Status Never  Smokeless Tobacco Never    Current Outpatient Medications on File Prior to Visit  Medication Sig Dispense Refill   triamcinolone ointment (KENALOG) 0.5 % Apply 1 Application topically 2 (two) times daily. Use sparingly for < 1 week 15 g 2   albuterol (VENTOLIN HFA) 108 (90 Base) MCG/ACT inhaler Inhale 1-2 puffs into the lungs every 4 (four) hours as needed for wheezing or shortness of breath. 1 each 0   blood glucose meter kit and supplies Dispense based on patient and insurance preference. Use up to four times daily as directed. (FOR ICD-10 E10.9, E11.9). check glucose fasting in am and TID before meals. 1 each 0   cyclobenzaprine (FLEXERIL) 5 MG tablet Take 1-2 tablets (5-10 mg total) by mouth at bedtime as needed for muscle spasms. 60 tablet 0   erythromycin ophthalmic ointment Place 1 Application into the left eye 4 (four) times daily. For 5 days 7 g 0   fluticasone (FLONASE) 50 MCG/ACT nasal spray Place 2 sprays into both nostrils daily. 16 g 6   fluticasone-salmeterol (WIXELA INHUB) 100-50 MCG/ACT AEPB Inhale 1 puff into the lungs 2 (two) times daily. 1 each 11   ipratropium (ATROVENT) 0.06 % nasal spray Place 2 sprays into both nostrils 4 (four) times daily. 15 mL 12   magic mouthwash SOLN 12mL Anbesol 30mL Benadryl 30mL Mylanta  5mL swish, gargle & spit q8hr prn throat discomfort 72 mL 0   meloxicam (MOBIC) 7.5 MG tablet Take 1 tablet (7.5 mg total) by mouth  daily. Take with food 30 tablet 1   Spacer/Aero-Holding Chambers (AEROCHAMBER PLUS) inhaler Use with inhaler 1 each 2   tirzepatide ([name]) 2.5 MG/0.5ML Pen Inject 2.5 mg into the skin once a week. 2 mL 3   No current facility-administered medications on file prior to visit.    ROS see history of present illness  Objective  Physical Exam Vitals:   07/09/23 1004  BP: (!) 140/80  Pulse: 80  Temp: 98.4 F (36.9 C)  SpO2: 96%    BP Readings from Last 3 Encounters:  07/09/23 (!) 140/80  03/29/23 136/72  02/27/23 130/78   Wt Readings from Last 3 Encounters:  07/09/23 212 lb 3.2 oz (96.3 kg)  03/29/23 207 lb 12.8 oz (94.3 kg)  02/27/23 207 lb 12.8 oz (94.3 kg)    Physical Exam Constitutional:      General: He is not in acute distress.    Appearance: Normal appearance.  HENT:     Head: Normocephalic.     Right Ear: Tympanic membrane normal.     Left Ear: Tympanic membrane normal.     Nose: Nose normal.     Mouth/Throat:     Mouth: Mucous membranes are moist.     Pharynx: Oropharynx is clear. Posterior oropharyngeal erythema present. No oropharyngeal exudate.  Eyes:     Conjunctiva/sclera: Conjunctivae normal.     Pupils: Pupils are equal, round, and reactive to light.  Cardiovascular:     Rate and Rhythm:  Normal rate and regular rhythm.     Heart sounds: Normal heart sounds.  Pulmonary:     Effort: Pulmonary effort is normal.     Breath sounds: Normal breath sounds.  Abdominal:     General: Abdomen is flat. Bowel sounds are normal.     Palpations: Abdomen is soft. There is no mass.     Tenderness: There is no abdominal tenderness.  Lymphadenopathy:     Cervical: No cervical adenopathy.  Skin:    General: Skin is warm and dry.  Neurological:     General: No focal deficit present.     Mental Status: He is alert.  Psychiatric:        Mood and Affect: Mood normal.        Behavior: Behavior normal.    Assessment/Plan: Please see individual problem  list.  Pharyngitis, unspecified etiology Assessment & Plan: Rapid strep swab in office negative. Posterior pharyngeal erythema present. Symptoms for one week. Will treat with Amoxicillin 875 BID x 10 days. Encouraged adequate fluid intake. He can take Tylenol/Ibuprofen as needed for pain. Discussed possible referral to ENT if symptoms persist. Return precautions given to patient.   Orders: -     POCT rapid strep A -     Amoxicillin; Take 1 tablet (875 mg total) by mouth 2 (two) times daily for 10 days.  Dispense: 20 tablet; Refill: 0    Return if symptoms worsen or fail to improve.   Bethanie Dicker, NP-C [facility] [employer]

## 2023-08-02 ENCOUNTER — Telehealth: Payer: Self-pay | Admitting: Family

## 2023-08-02 ENCOUNTER — Other Ambulatory Visit: Payer: Self-pay | Admitting: Family

## 2023-08-02 MED ORDER — ALBUTEROL SULFATE HFA 108 (90 BASE) MCG/ACT IN AERS: 1.0000 | INHALATION_SPRAY | RESPIRATORY_TRACT | 3 refills | Status: DC | PRN

## 2023-08-02 NOTE — Telephone Encounter (Signed)
Rx sent in to pharmacy pt is aware 

## 2023-08-02 NOTE — Telephone Encounter (Signed)
Patient just called and said he needs a refill. albuterol (VENTOLIN HFA) 108 (90 Base) MCG/ACT inhaler. The pharmacy he uses is [id number] [place], [place] - [address] AT [facility] OF [address] [facility] [address] [place], [place] [address] Phone: [phone]  Fax: [phone]  His number is [phone]

## 2023-08-07 ENCOUNTER — Telehealth: Payer: Self-pay | Admitting: Family

## 2023-08-07 MED ORDER — ALBUTEROL SULFATE HFA 108 (90 BASE) MCG/ACT IN AERS: 1.0000 | INHALATION_SPRAY | RESPIRATORY_TRACT | 3 refills | Status: DC | PRN

## 2023-08-07 NOTE — Telephone Encounter (Signed)
Spoke to pt and [facility] [place]

## 2023-08-07 NOTE — Telephone Encounter (Signed)
Patient just called and asked for a refill. The name is albuterol (VENTOLIN HFA) 108 (90 Base) MCG/ACT inhaler. The pharmacy he would [facility] for [facility] to go [facility] [address] [place], [place] [address]. Their number is [phone].

## 2023-08-08 ENCOUNTER — Ambulatory Visit: Payer: 59 | Admitting: Nurse Practitioner

## 2023-08-08 ENCOUNTER — Encounter: Payer: Self-pay | Admitting: Nurse Practitioner

## 2023-08-08 VITALS — BP 142/88 | HR 74 | Temp 98.5°F | Ht 66.0 in | Wt 213.0 lb

## 2023-08-08 DIAGNOSIS — J302 Other seasonal allergic rhinitis: Secondary | ICD-10-CM

## 2023-08-08 DIAGNOSIS — J4530 Mild persistent asthma, uncomplicated: Secondary | ICD-10-CM

## 2023-08-08 MED ORDER — FLUTICASONE-SALMETEROL 100-50 MCG/ACT IN AEPB: 1.0000 | INHALATION_SPRAY | Freq: Two times a day (BID) | RESPIRATORY_TRACT | 11 refills | Status: DC

## 2023-08-08 MED ORDER — ALBUTEROL SULFATE HFA 108 (90 BASE) MCG/ACT IN AERS: 1.0000 | INHALATION_SPRAY | RESPIRATORY_TRACT | 5 refills | Status: DC | PRN

## 2023-08-08 MED ORDER — MONTELUKAST SODIUM 10 MG PO TABS: 10.0000 mg | ORAL_TABLET | Freq: Every day | ORAL | 3 refills | Status: DC

## 2023-08-08 MED ORDER — IPRATROPIUM BROMIDE 0.06 % NA SOLN: 2.0000 | Freq: Four times a day (QID) | NASAL | 12 refills | Status: DC

## 2023-08-08 NOTE — Assessment & Plan Note (Signed)
Patient requesting refills on his inhalers. Anticipating increase in symptoms due to weather changes and allergies. He is using his albuterol inhaler 1-3 times per week currently. He feels limited in his exercise abilities due to his asthma. Will add Singulair 10 mg daily to regimen. He will continue Wixela 1 puff twice daily and Albuterol PRN. Refills sent. Return precautions given to patient.

## 2023-08-08 NOTE — Progress Notes (Signed)
Bethanie Dicker, NP-C Phone: [phone]  Brandon Marsh is a 37 y.o. male who presents today for asthma.   Asthma Follow-up: He has previously been evaluated here for asthma and presents for an asthma follow-up; he is not currently in exacerbation. Symptoms currently include chest tightness, dyspnea, and productive cough and occur  weekly, with exercise and illness/weather changes . Observed precipitants include cold air, exercise, pollens, smoke, and upper respiratory infection.  Current limitations in activity from asthma:  cannot work out like he is wanting .  Number of days of school or work missed in the last month: 0. Number of Emergency Department visits in the previous month: none. Frequency of use of quick-relief meds: 1-3 times per week. The patient reports adherence to this regimen. He is requesting refills on all of his medications.   Social History   Tobacco Use  Smoking Status Never  Smokeless Tobacco Never    Current Outpatient Medications on File Prior to Visit  Medication Sig Dispense Refill   triamcinolone ointment (KENALOG) 0.5 % Apply 1 Application topically 2 (two) times daily. Use sparingly for < 1 week 15 g 2   blood glucose meter kit and supplies Dispense based on patient and insurance preference. Use up to four times daily as directed. (FOR ICD-10 E10.9, E11.9). check glucose fasting in am and TID before meals. 1 each 0   cyclobenzaprine (FLEXERIL) 5 MG tablet Take 1-2 tablets (5-10 mg total) by mouth at bedtime as needed for muscle spasms. 60 tablet 0   erythromycin ophthalmic ointment Place 1 Application into the left eye 4 (four) times daily. For 5 days 7 g 0   fluticasone (FLONASE) 50 MCG/ACT nasal spray Place 2 sprays into both nostrils daily. 16 g 6   magic mouthwash SOLN 12mL Anbesol 30mL Benadryl 30mL Mylanta  5mL swish, gargle & spit q8hr prn throat discomfort 72 mL 0   meloxicam (MOBIC) 7.5 MG tablet Take 1 tablet (7.5 mg total) by mouth daily. Take  with food 30 tablet 1   Spacer/Aero-Holding Chambers (AEROCHAMBER PLUS) inhaler Use with inhaler 1 each 2   tirzepatide ([name]) 2.5 MG/0.5ML Pen ADMINISTER 2.5 MG UNDER THE SKIN 1 TIME A WEEK 2 mL 0   No current facility-administered medications on file prior to visit.   ROS see history of present illness  Objective  Physical Exam Vitals:   08/08/23 0839 08/08/23 0853  BP: (!) 140/88 (!) 142/88  Pulse: 74   Temp: 98.5 F (36.9 C)   SpO2: 96%     BP Readings from Last 3 Encounters:  08/08/23 (!) 142/88  07/09/23 (!) 140/80  03/29/23 136/72   Wt Readings from Last 3 Encounters:  08/08/23 213 lb (96.6 kg)  07/09/23 212 lb 3.2 oz (96.3 kg)  03/29/23 207 lb 12.8 oz (94.3 kg)    Physical Exam Constitutional:      General: He is not in acute distress.    Appearance: Normal appearance.  HENT:     Head: Normocephalic.  Cardiovascular:     Rate and Rhythm: Normal rate and regular rhythm.     Heart sounds: Normal heart sounds.  Pulmonary:     Effort: Pulmonary effort is normal. No respiratory distress.     Breath sounds: Normal breath sounds. No stridor. No wheezing or rhonchi.  Skin:    General: Skin is warm and dry.  Neurological:     General: No focal deficit present.     Mental Status: He is alert.  Psychiatric:  Mood and Affect: Mood normal.        Behavior: Behavior normal.    Assessment/Plan: Please see individual problem list.  Mild persistent asthma without complication Assessment & Plan: Patient requesting refills on his inhalers. Anticipating increase in symptoms due to weather changes and allergies. He is using his albuterol inhaler 1-3 times per week currently. He feels limited in his exercise abilities due to his asthma. Will add Singulair 10 mg daily to regimen. He will continue Wixela 1 puff twice daily and Albuterol PRN. Refills sent. Return precautions given to patient.   Orders: -     Albuterol Sulfate HFA; Inhale 1-2 puffs into the lungs  every 4 (four) hours as needed for wheezing or shortness of breath.  Dispense: 18 g; Refill: 5 -     Montelukast Sodium; Take 1 tablet (10 mg total) by mouth at bedtime.  Dispense: 90 tablet; Refill: 3 -     Fluticasone-Salmeterol; Inhale 1 puff into the lungs 2 (two) times daily.  Dispense: 60 each; Refill: 11  Seasonal allergic rhinitis, unspecified trigger -     Ipratropium Bromide; Place 2 sprays into both nostrils 4 (four) times daily.  Dispense: 15 mL; Refill: 12 -     Montelukast Sodium; Take 1 tablet (10 mg total) by mouth at bedtime.  Dispense: 90 tablet; Refill: 3    Return if symptoms worsen or fail to improve.   Bethanie Dicker, NP-C [facility] - [employer]

## 2023-09-17 ENCOUNTER — Other Ambulatory Visit: Payer: Self-pay | Admitting: Family

## 2023-09-26 ENCOUNTER — Ambulatory Visit: Payer: 59 | Admitting: Family

## 2023-09-26 ENCOUNTER — Encounter: Payer: Self-pay | Admitting: Family

## 2023-09-26 VITALS — BP 127/80 | HR 86 | Temp 98.2°F | Ht 66.0 in | Wt 214.8 lb

## 2023-09-26 DIAGNOSIS — J4530 Mild persistent asthma, uncomplicated: Secondary | ICD-10-CM | POA: Diagnosis not present

## 2023-09-26 DIAGNOSIS — E1165 Type 2 diabetes mellitus with hyperglycemia: Secondary | ICD-10-CM

## 2023-09-26 DIAGNOSIS — Z7985 Long-term (current) use of injectable non-insulin antidiabetic drugs: Secondary | ICD-10-CM

## 2023-09-26 MED ORDER — FLUTICASONE PROPIONATE 50 MCG/ACT NA SUSP: 2.0000 | Freq: Every day | NASAL | 6 refills | Status: DC

## 2023-09-26 MED ORDER — WIXELA INHUB 250-50 MCG/ACT IN AEPB: 1.0000 | INHALATION_SPRAY | Freq: Two times a day (BID) | RESPIRATORY_TRACT | 1 refills | Status: DC

## 2023-09-26 MED ORDER — ALBUTEROL SULFATE HFA 108 (90 BASE) MCG/ACT IN AERS: 1.0000 | INHALATION_SPRAY | RESPIRATORY_TRACT | 5 refills | Status: AC | PRN

## 2023-09-26 MED ORDER — MOUNJARO 2.5 MG/0.5ML ~~LOC~~ SOAJ: 2.5000 mg | SUBCUTANEOUS | 0 refills | Status: DC

## 2023-09-26 NOTE — Progress Notes (Signed)
Assessment & Plan:  Mild persistent asthma without complication Assessment & Plan: Suboptimal control.  No acute respiratory distress.  Reassuring HEENT exam today without adventitious lung sounds.Onset after changed from symbicort 160/4.5 to wixela 100/50 due to insurance.  I have changed Wixela to moderate dose to 250-50.  Patient politely declines prednisone taper at this time.  Brandon Marsh will continue as needed use of albuterol.  Continue daily use of Flonase.  Brandon Marsh did not start Singulair, may consider at follow-up  Orders: -     Albuterol Sulfate HFA; Inhale 1-2 puffs into the lungs every 4 (four) hours as needed for wheezing or shortness of breath.  Dispense: 18 g; Refill: 5 -     Fluticasone Propionate; Place 2 sprays into both nostrils daily.  Dispense: 16 g; Refill: 6 -     Wixela Inhub; Inhale 1 puff into the lungs in the morning and at bedtime.  Dispense: 60 each; Refill: 1  Uncontrolled type 2 diabetes mellitus with hyperglycemia (HCC) Assessment & Plan: Pending A1c.  Continue [name] 2.5 mg  Orders: -     Hemoglobin A1c -     Microalbumin / creatinine urine ratio -     Comprehensive metabolic panel  Other orders -     [name]; Inject 2.5 mg into the skin once a week.  Dispense: 2 mL; Refill: 0     Return precautions given.   Risks, benefits, and alternatives of the medications and treatment plan prescribed today were discussed, and patient expressed understanding.   Education regarding symptom management and diagnosis given to patient on AVS either electronically or printed.  No follow-ups on file.  Brandon Plowman, FNP  Subjective:    Patient ID: Brandon Marsh, Brandon Marsh    DOB: 16-Nov-1986, 37 y.o.   MRN: [id number]  CC: Brandon Marsh is a 37 y.o. Brandon Marsh who presents today for follow up.   HPI: Complains of sob , wheezing x 3 weeks ago, more constant.  Onset after changed from symbicort 160/4.5 to wixela 100/50 Denies fever, chills, CP, leg swelling, orthopnea ,  sinus pain.   Uses albuterol more regularly , 1-2 per day over the past 3 weeks.   Compliant with flonase.   No recent surgery, h/o cancer. No recent immobilization.   BP at home 127/80      Allergies: [name] [tirzepatide] Current Outpatient Medications on File Prior to Visit  Medication Sig Dispense Refill   blood glucose meter kit and supplies Dispense based on patient and insurance preference. Use up to four times daily as directed. (FOR ICD-10 E10.9, E11.9). check glucose fasting in am and TID before meals. 1 each 0   erythromycin ophthalmic ointment Place 1 Application into the left eye 4 (four) times daily. For 5 days 7 g 0   meloxicam (MOBIC) 7.5 MG tablet Take 1 tablet (7.5 mg total) by mouth daily. Take with food 30 tablet 1   Spacer/Aero-Holding Chambers (AEROCHAMBER PLUS) inhaler Use with inhaler 1 each 2   No current facility-administered medications on file prior to visit.    Review of Systems  Constitutional:  Negative for chills and fever.  HENT:  Negative for congestion, sinus pressure and sore throat.   Respiratory:  Positive for shortness of breath and wheezing. Negative for cough.   Cardiovascular:  Negative for chest pain, palpitations and leg swelling.  Gastrointestinal:  Negative for nausea and vomiting.      Objective:    BP 127/80   Pulse 86   Temp  98.2 F (36.8 C) (Temporal)   Ht 5\' 6"  (1.676 m)   Wt 214 lb 12.8 oz (97.4 kg)   SpO2 98%   BMI 34.67 kg/m  BP Readings from Last 3 Encounters:  09/26/23 127/80  08/08/23 (!) 142/88  07/09/23 (!) 140/80   Wt Readings from Last 3 Encounters:  09/26/23 214 lb 12.8 oz (97.4 kg)  08/08/23 213 lb (96.6 kg)  07/09/23 212 lb 3.2 oz (96.3 kg)    Physical Exam Vitals reviewed.  Constitutional:      Appearance: Brandon Marsh is well-developed.  HENT:     Head: Normocephalic and atraumatic.     Right Ear: Hearing, tympanic membrane, ear canal and external ear normal. No decreased hearing noted. No drainage,  swelling or tenderness. No middle ear effusion. Tympanic membrane is not injected, erythematous or bulging.     Left Ear: Hearing, tympanic membrane, ear canal and external ear normal. No decreased hearing noted. No drainage, swelling or tenderness.  No middle ear effusion. Tympanic membrane is not injected, erythematous or bulging.     Nose: Nose normal.     Right Sinus: No maxillary sinus tenderness or frontal sinus tenderness.     Left Sinus: No maxillary sinus tenderness or frontal sinus tenderness.     Mouth/Throat:     Pharynx: Uvula midline. No oropharyngeal exudate or posterior oropharyngeal erythema.     Tonsils: No tonsillar abscesses.  Eyes:     Conjunctiva/sclera: Conjunctivae normal.  Cardiovascular:     Rate and Rhythm: Regular rhythm.     Heart sounds: Normal heart sounds.  Pulmonary:     Effort: Pulmonary effort is normal. No respiratory distress.     Breath sounds: Normal breath sounds. No decreased air movement. No decreased breath sounds, wheezing, rhonchi or rales.  Lymphadenopathy:     Head:     Right side of head: No submental, submandibular, tonsillar, preauricular, posterior auricular or occipital adenopathy.     Left side of head: No submental, submandibular, tonsillar, preauricular, posterior auricular or occipital adenopathy.     Cervical: No cervical adenopathy.  Skin:    General: Skin is warm and dry.  Neurological:     Mental Status: Brandon Marsh is alert.  Psychiatric:        Speech: Speech normal.        Behavior: Behavior normal.

## 2023-09-26 NOTE — Assessment & Plan Note (Signed)
Suboptimal control.  No acute respiratory distress.  Reassuring HEENT exam today without adventitious lung sounds.Onset after changed from symbicort 160/4.5 to wixela 100/50 due to insurance.  I have changed Wixela to moderate dose to 250-50.  Patient politely declines prednisone taper at this time.  He will continue as needed use of albuterol.  Continue daily use of Flonase.  He did not start Singulair, may consider at follow-up

## 2023-09-26 NOTE — Assessment & Plan Note (Signed)
Pending A1c.  Continue [name] 2.5 mg

## 2023-09-27 LAB — COMPREHENSIVE METABOLIC PANEL
ALT: 50 U/L (ref 0–53)
AST: 28 U/L (ref 0–37)
Albumin: 4.8 g/dL (ref 3.5–5.2)
Alkaline Phosphatase: 71 U/L (ref 39–117)
BUN: 14 mg/dL (ref 6–23)
CO2: 29 meq/L (ref 19–32)
Calcium: 10.4 mg/dL (ref 8.4–10.5)
Chloride: 98 meq/L (ref 96–112)
Creatinine, Ser: 1.14 mg/dL (ref 0.40–1.50)
GFR: 83.13 mL/min (ref >60.00)
Glucose, Bld: 82 mg/dL (ref 70–99)
Potassium: 4.1 meq/L (ref 3.5–5.1)
Sodium: 138 meq/L (ref 135–145)
Total Bilirubin: 0.6 mg/dL (ref 0.2–1.2)
Total Protein: 7.5 g/dL (ref 6.0–8.3)

## 2023-09-27 LAB — HEMOGLOBIN A1C: Hgb A1c MFr Bld: 6.2 % (ref 4.6–6.5)

## 2023-09-27 LAB — MICROALBUMIN / CREATININE URINE RATIO
Creatinine,U: 149.5 mg/dL
Microalb Creat Ratio: 0.5 mg/g (ref 0.0–30.0)
Microalb, Ur: <0.7 mg/dL (ref 0.0–1.9)

## 2023-09-28 ENCOUNTER — Encounter: Payer: Self-pay | Admitting: Family

## 2023-09-30 NOTE — Telephone Encounter (Signed)
Pt is requesting symbicort.

## 2023-10-01 ENCOUNTER — Other Ambulatory Visit: Payer: Self-pay | Admitting: Family

## 2023-10-01 DIAGNOSIS — J4530 Mild persistent asthma, uncomplicated: Secondary | ICD-10-CM

## 2023-10-01 MED ORDER — BUDESONIDE-FORMOTEROL FUMARATE 160-4.5 MCG/ACT IN AERO: 2.0000 | INHALATION_SPRAY | Freq: Two times a day (BID) | RESPIRATORY_TRACT | 3 refills | Status: DC

## 2023-10-01 MED ORDER — ALBUTEROL SULFATE (2.5 MG/3ML) 0.083% IN NEBU: 2.5000 mg | INHALATION_SOLUTION | Freq: Four times a day (QID) | RESPIRATORY_TRACT | 1 refills | Status: DC | PRN

## 2023-10-01 NOTE — Telephone Encounter (Signed)
I called and spoke with patient this morning.  He states he been using his albuterol inhaler 3-4 times overnight.  He was able to use his son's nebulizer machine with albuterol which was helpful for him.  Denies chest pain, fever.  He does not feel he needs to be reevaluated at this time.  He politely declines chest x-ray or prednisone taper.  He would like to stop wixela and start Symbicort again.  I have sent in Symbicort and albuterol nebulizer solution for him.  He will let me know how he is doing

## 2023-10-01 NOTE — Telephone Encounter (Signed)
Spoke to pt and he would just like to know if you can end in [facility], has field trip with his son today

## 2023-10-02 ENCOUNTER — Other Ambulatory Visit: Payer: Self-pay

## 2023-10-02 DIAGNOSIS — J4530 Mild persistent asthma, uncomplicated: Secondary | ICD-10-CM

## 2023-10-02 MED ORDER — BUDESONIDE-FORMOTEROL FUMARATE 160-4.5 MCG/ACT IN AERO: 2.0000 | INHALATION_SPRAY | Freq: Two times a day (BID) | RESPIRATORY_TRACT | 3 refills | Status: DC

## 2023-10-02 NOTE — Telephone Encounter (Signed)
Spoke to pharmacist and Symbicort brand name or  generic is not covered through insurance. We have to send in [place] or [place] for insurance to cover

## 2023-10-02 NOTE — Telephone Encounter (Signed)
Patient states he is returning our call.  I read Jenate [place], CMA's message to patient.  Patient asked if we were going to send in the prescription for [facility] or [facility].  I spoke with Jenate and transferred call to her.

## 2023-10-02 NOTE — Telephone Encounter (Signed)
RX sent into pharmacy

## 2023-10-10 IMAGING — DX DG HAND COMPLETE 3+V*R*
3 series · 3 of 3 positions shown · non-contrast
Comparison: X-ray 04/21/2020.

CLINICAL DATA: Right hand/wrist pain, status post injury due to
fall.

EXAM:
RIGHT HAND - COMPLETE 3+ VIEW

[hand pa]
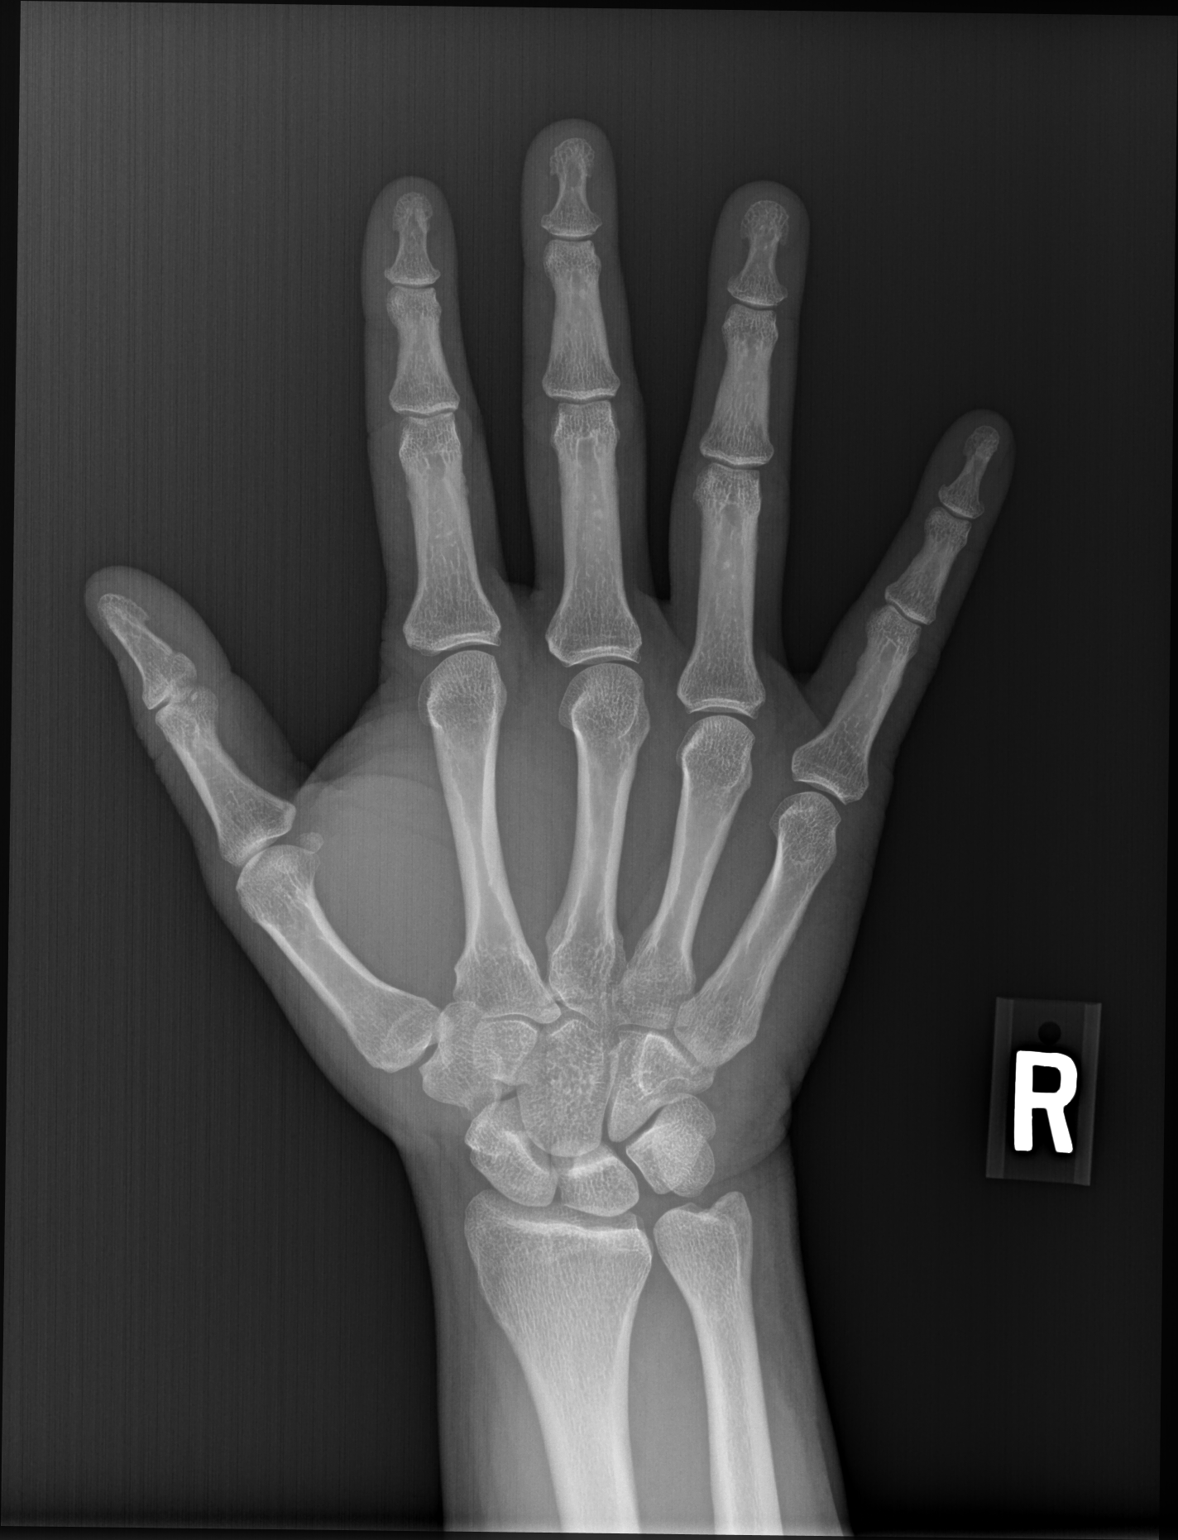

[hand obl (oblique)]
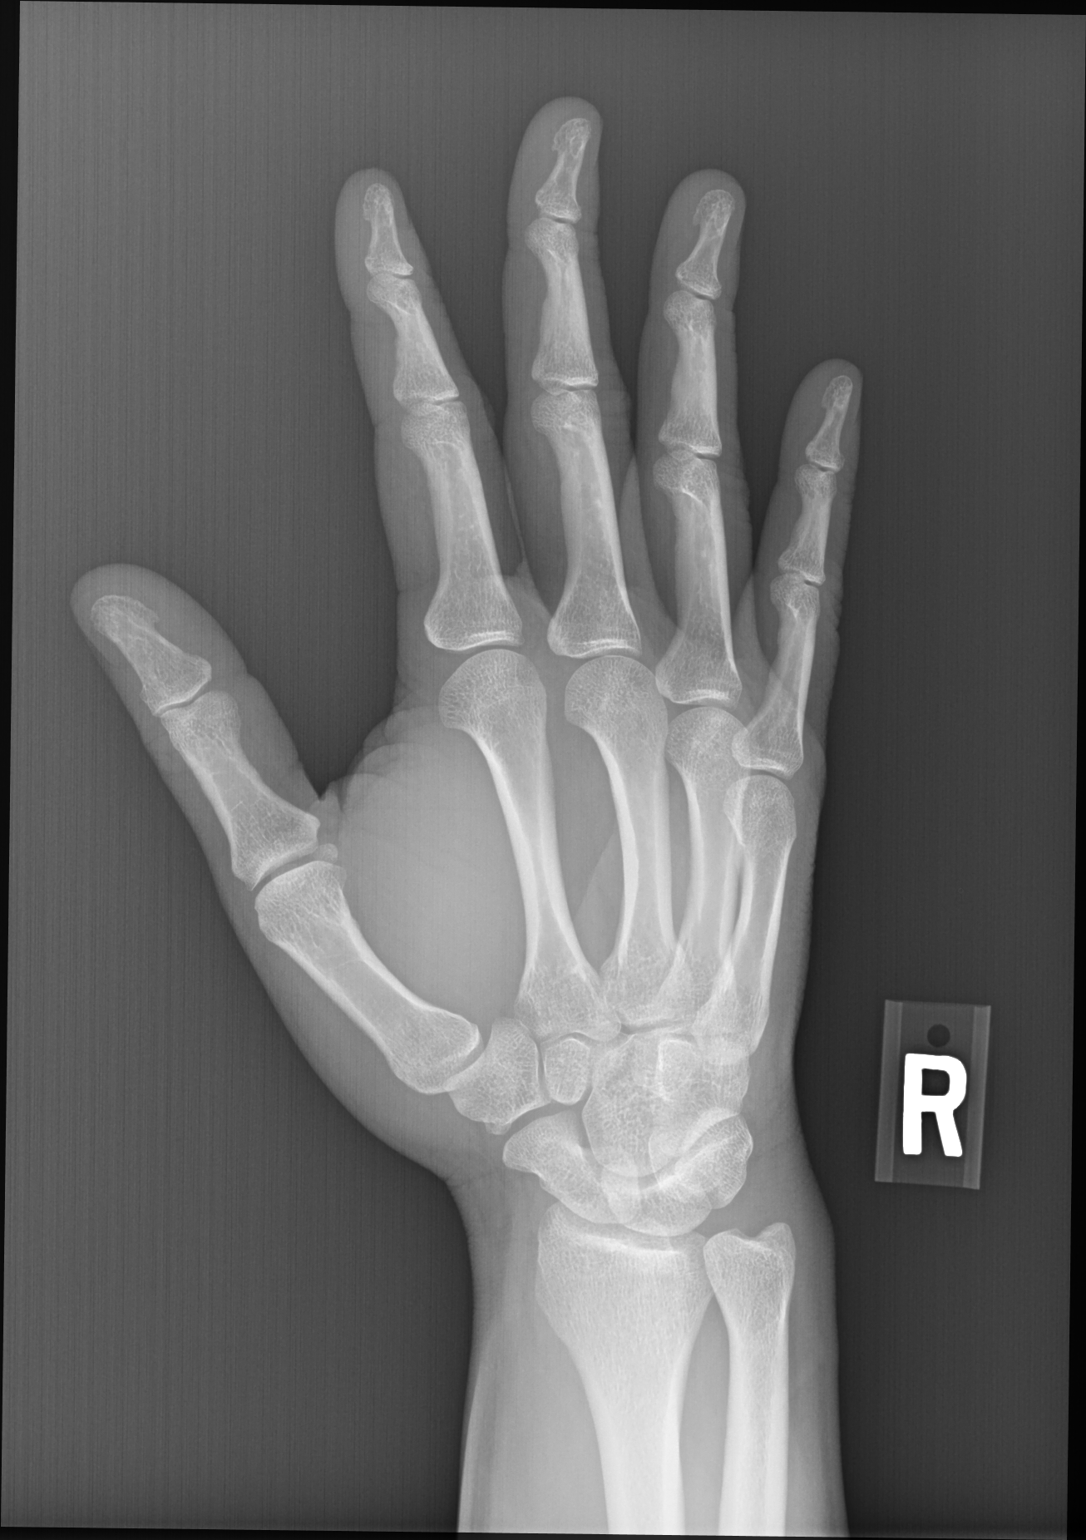

[hand lat]
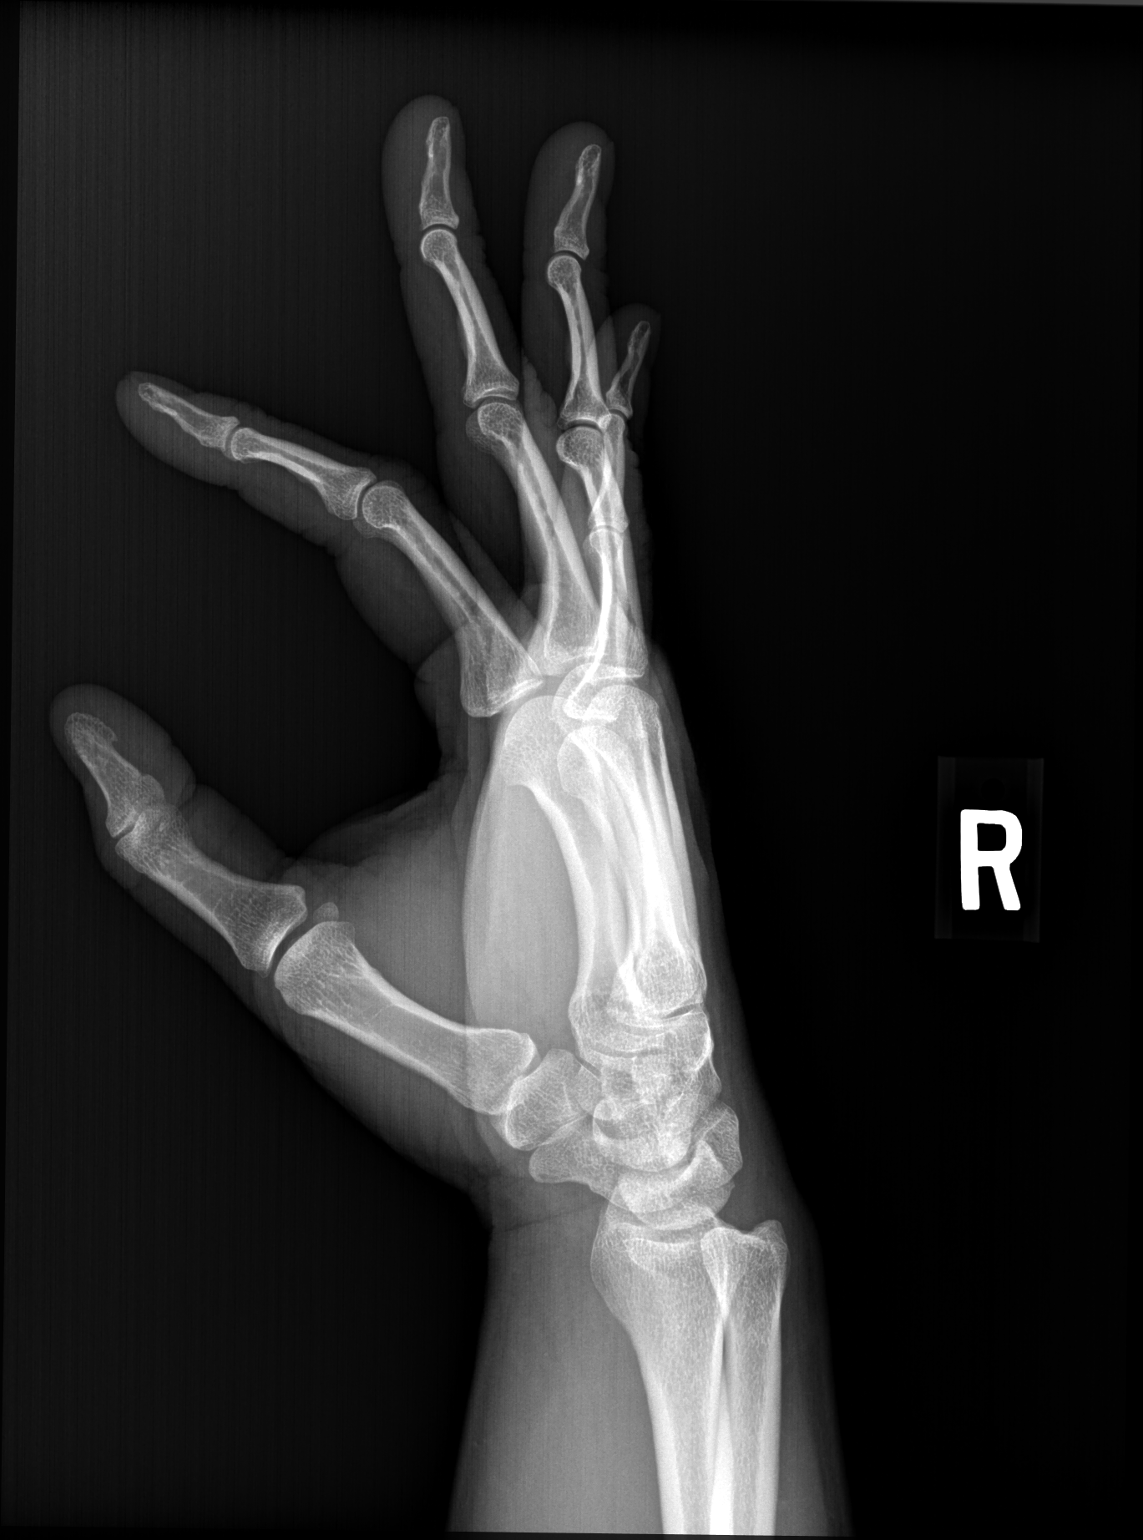

[3 of 3 positions shown; findings below may reference images not displayed]

FINDINGS: There is no evidence of fracture or dislocation. Normal joint spaces
and alignment. There is no evidence of arthropathy or other focal
bone abnormality. Intact carpal bones. Soft tissues are
unremarkable.
IMPRESSION: No fracture or dislocation of the right hand or wrist.

## 2023-10-23 ENCOUNTER — Ambulatory Visit (INDEPENDENT_AMBULATORY_CARE_PROVIDER_SITE_OTHER): Payer: 59 | Admitting: Family

## 2023-10-23 ENCOUNTER — Encounter: Payer: Self-pay | Admitting: Family

## 2023-10-23 VITALS — BP 130/76 | HR 85 | Temp 98.2°F | Ht 66.0 in | Wt 201.6 lb

## 2023-10-23 DIAGNOSIS — J4530 Mild persistent asthma, uncomplicated: Secondary | ICD-10-CM

## 2023-10-23 DIAGNOSIS — E1165 Type 2 diabetes mellitus with hyperglycemia: Secondary | ICD-10-CM | POA: Diagnosis not present

## 2023-10-23 DIAGNOSIS — Z7985 Long-term (current) use of injectable non-insulin antidiabetic drugs: Secondary | ICD-10-CM | POA: Diagnosis not present

## 2023-10-23 DIAGNOSIS — R21 Rash and other nonspecific skin eruption: Secondary | ICD-10-CM | POA: Diagnosis not present

## 2023-10-23 MED ORDER — CLOTRIMAZOLE 1 % EX OINT
1.0000 | TOPICAL_OINTMENT | Freq: Two times a day (BID) | CUTANEOUS | 0 refills | Status: AC
Start: 2023-10-23 — End: 2023-10-30

## 2023-10-23 MED ORDER — FLUTICASONE-SALMETEROL 250-50 MCG/ACT IN AEPB: 1.0000 | INHALATION_SPRAY | Freq: Two times a day (BID) | RESPIRATORY_TRACT | 3 refills | Status: DC

## 2023-10-23 MED ORDER — TIRZEPATIDE 5 MG/0.5ML ~~LOC~~ SOAJ: 5.0000 mg | SUBCUTANEOUS | 3 refills | Status: DC

## 2023-10-23 NOTE — Progress Notes (Unsigned)
   Assessment & Plan:  There are no diagnoses linked to this encounter.   Return precautions given.   Risks, benefits, and alternatives of the medications and treatment plan prescribed today were discussed, and patient expressed understanding.   Education regarding symptom management and diagnosis given to patient on AVS either electronically or printed.  No follow-ups on file.  [name], FNP  Subjective:    Patient ID: Brandon Marsh, male    DOB: 03-Jul-1986, 37 y.o.   MRN: [id number]  CC: Brandon Marsh is a 37 y.o. male who presents today for an acute visit.    HPI: He started to monistat  He is working out daily.   No cp, sob, wheezing.   He is using wixela   Using Dr Sharyn Blitz soap all over , a new soap.   No new   He has been using mounjaro 5mg  Goal is 187lb.    Allergies: [name] [tirzepatide] Current Outpatient Medications on File Prior to Visit  Medication Sig Dispense Refill   albuterol (PROVENTIL) (2.5 MG/3ML) 0.083% nebulizer solution Take 3 mLs (2.5 mg total) by nebulization every 6 (six) hours as needed for wheezing or shortness of breath. 150 mL 1   albuterol (VENTOLIN HFA) 108 (90 Base) MCG/ACT inhaler Inhale 1-2 puffs into the lungs every 4 (four) hours as needed for wheezing or shortness of breath. 18 g 5   blood glucose meter kit and supplies Dispense based on patient and insurance preference. Use up to four times daily as directed. (FOR ICD-10 E10.9, E11.9). check glucose fasting in am and TID before meals. 1 each 0   budesonide-formoterol (SYMBICORT) 160-4.5 MCG/ACT inhaler Inhale 2 puffs into the lungs 2 (two) times daily. 1 each 3   erythromycin ophthalmic ointment Place 1 Application into the left eye 4 (four) times daily. For 5 days 7 g 0   fluticasone (FLONASE) 50 MCG/ACT nasal spray Place 2 sprays into both nostrils daily. 16 g 6   meloxicam (MOBIC) 7.5 MG tablet Take 1 tablet (7.5 mg total) by mouth daily. Take with food 30 tablet 1    Spacer/Aero-Holding Chambers (AEROCHAMBER PLUS) inhaler Use with inhaler 1 each 2   tirzepatide ([name]) 2.5 MG/0.5ML Pen Inject 2.5 mg into the skin once a week. 2 mL 0   No current facility-administered medications on file prior to visit.    Review of Systems    Objective:    BP 130/76   Pulse 85   Temp 98.2 F (36.8 C) (Oral)   Ht 5\' 6"  (1.676 m)   Wt 201 lb 9.6 oz (91.4 kg)   SpO2 97%   BMI 32.54 kg/m   BP Readings from Last 3 Encounters:  10/23/23 130/76  09/26/23 127/80  08/08/23 (!) 142/88   Wt Readings from Last 3 Encounters:  10/23/23 201 lb 9.6 oz (91.4 kg)  09/26/23 214 lb 12.8 oz (97.4 kg)  08/08/23 213 lb (96.6 kg)    Physical Exam

## 2023-10-24 NOTE — Assessment & Plan Note (Signed)
Lab Results  Component Value Date   HGBA1C 6.2 09/26/2023   Excellent diabetic control.  Patient request increase of [name] for additional weight loss.  I have increase to [name] 5 mg with strict precautions in regards to no weight loss greater than 2 pounds per week.  He will stay vigilant for constipation, nausea.

## 2023-10-24 NOTE — Assessment & Plan Note (Signed)
Discussed likely Candida intertrigo.  We also discussed new soap and irritation from working out so frequently. Discussed use of zinc oxide to act as a barrier cream.  Advised to keep the areas dry as possible.  Provide him with clotrimazole cream if he were to need.

## 2023-10-24 NOTE — Assessment & Plan Note (Signed)
Symptoms improved with weight loss.  If symptoms persist or escalate again, we will arrange pulmonology consult for formal testing

## 2023-10-26 ENCOUNTER — Other Ambulatory Visit: Payer: Self-pay | Admitting: Family

## 2023-12-11 ENCOUNTER — Other Ambulatory Visit: Payer: Self-pay | Admitting: Family

## 2023-12-11 DIAGNOSIS — J4530 Mild persistent asthma, uncomplicated: Secondary | ICD-10-CM

## 2023-12-14 ENCOUNTER — Encounter: Payer: Self-pay | Admitting: Family

## 2023-12-17 ENCOUNTER — Other Ambulatory Visit: Payer: Self-pay | Admitting: Family

## 2023-12-17 ENCOUNTER — Ambulatory Visit: Payer: Self-pay | Admitting: Family

## 2023-12-17 ENCOUNTER — Encounter: Payer: Self-pay | Admitting: Pharmacist

## 2023-12-17 DIAGNOSIS — J4530 Mild persistent asthma, uncomplicated: Secondary | ICD-10-CM

## 2023-12-17 NOTE — Telephone Encounter (Signed)
 Pt  has appt with Kacy on 12/18/23. Spoke to [place] and informed her that Pulmonology referral has been placed as well

## 2023-12-17 NOTE — Telephone Encounter (Signed)
 LVM to call back to office to go over following  Call patient and triage Please ensure that he is okay.  How many times a day is he using his albuterol  inhaler?  Does he have any more the Symbicort  left?  Please let him know that I have reached out to pharmacy to see what alternatives are on his formulary.   Please triage and ensure he is safe.  I have placed referral to pulmonology

## 2023-12-17 NOTE — Telephone Encounter (Signed)
 Copied from CRM [id number]. Topic: Clinical - Red Word Triage >> Dec 17, 2023  4:34 PM Isabell A wrote: [facility] that prompted transfer to Nurse Triage: Asthma   Chief Complaint: Asthma exacerbation  Symptoms: Shortness of breath, headache, croupy cough, sputum production  Frequency: Frequent  Disposition: [] ED /[] Urgent Care (no appt availability in office) / [x] Appointment(In office/virtual)/ []  [place] [facility]/ [] Home Care/ [] Refused Recommended Disposition /[] [place] [facility]/ []  Follow-up with PCP Additional Notes: Patient reports he has been having increased asthma problems since changing his medication from Symbicort  to [facility]. He states that his PCP is aware and has adjusted the dose once, but that it is till not effective. Patient states that he has been using his breathing treatments at home which do alleviate his symptoms when present. He states he has also been experiencing a croupy cough, headache, and sputum production recently. Appointment made for patient in the morning and patient was instructed to call back for any new or worsening symptoms.     Reason for Disposition  [1] Mild wheezing comes and goes AND [2] present > 3 days  Answer Assessment - Initial Assessment Questions 1. RESPIRATORY STATUS: Describe your breathing? (e.g., wheezing, shortness of breath, unable to speak, severe coughing)      Wheezy and out of breath  2. ONSET: When did this asthma attack begin?      Persistent problems for a couple of months   3. TRIGGER: What do you think triggered this attack? (e.g., URI, exposure to pollen or other allergen, tobacco smoke)      Medication changed from Symbicort  to Wixela 4. PEAK EXPIRATORY FLOW RATE (PEFR): Do you use a peak flow meter? If Yes, ask: What's the current peak flow? What's your personal best peak flow?      No 5. SEVERITY: How bad is this attack?    - MILD: No SOB at rest, mild SOB with walking, speaks normally in sentences, can  lie down, no retractions, pulse < 100. (GREEN Zone: PEFR 80-100%)   - MODERATE: SOB at rest, SOB with minimal exertion and prefers to sit, cannot lie down flat, speaks in phrases, mild retractions, audible wheezing, pulse 100-120. (YELLOW Zone: PEFR 50-79%)    - SEVERE: Struggling for each breath, speaks in single words, struggling to breathe, sitting hunched forward, retractions, usually loud wheezing, sometimes minimal wheezing because of decreased air movement, pulse > 120. (RED Zone: PEFR < 50%).      Mild 6. ASTHMA MEDICINES:  What treatments have you tried?    - INHALED QUICK RELIEF (RESCUE): What is your inhaled quick-relief medicine? (e.g., albuterol , salbutamol) Do you use an inhaler or a nebulizer? How frequently have you been using this medicine?   - CONTROLLER (LONG-TERM-CONTROL): Do you take an inhaled steroid? (e.g., Asmanex, Flovent , Pulmicort , Qvar)     Used medications, but only gives short relief  7. INHALED QUICK-RELIEF TREATMENTS FOR THIS ATTACK: What treatments have you given yourself so far? and How many and how often? If using an inhaler, ask, How many puffs? Note: Routine treatments are 2 puffs every 4 hours as needed. Rescue treatments are 4 puffs repeated every 20 minutes, up to three times as needed.      Yes, improved symptoms  8. OTHER SYMPTOMS: Do you have any other symptoms? (e.g., chest pain, coughing up yellow sputum, fever, runny nose)     Headache, lightheadedness, croupy, sputum production  9. O2 SATURATION MONITOR:  Do you use an oxygen saturation monitor (  pulse oximeter) at home? If Yes, What is your reading (oxygen level) today? What is your usual oxygen saturation reading? (e.g., 95%)     95% after breathing treatment  Protocols used: Asthma Attack-A-AH

## 2023-12-17 NOTE — Progress Notes (Signed)
 Bluford Burkitt, NP-C Phone: [phone]  Brandon Marsh is a 38 y.o. male who presents today for asthma exacerbation.   Discussed the use of AI scribe software for clinical note transcription with the patient, who gave verbal consent to proceed.  History of Present Illness   The patient, with a known history of asthma, presented with complaints of increased difficulty in breathing and frequent asthma attacks. They reported a change in their medication from Symbicort  to a generic alternative due to insurance coverage issues. Since the change in October, they have been experiencing increased asthma symptoms, necessitating the use of their rescue inhaler three to four times a day.  The patient reported almost passing out the previous day due to shortness of breath and has been experiencing persistent headaches. They also noted an increase in mucus production when coughing. Despite these symptoms, the patient has been able to continue their workout regimen, although they reported increased shortness of breath during exercise.  The patient has been managing their symptoms with albuterol  and nebulizer treatments, and they have been taking Zyrtec  and using Flonase  for allergies. They expressed a desire to return to their previous medication, Symbicort , and reported that they had already initiated the process for prior authorization with their insurance.  The patient declined the use of prednisone  due to previous experiences of significant weight gain on this medication. They expressed confidence in their ability to manage their symptoms once they return to their previous medication regimen.      Social History   Tobacco Use  Smoking Status Never  Smokeless Tobacco Never    Current Outpatient Medications on File Prior to Visit  Medication Sig Dispense Refill   albuterol  (PROVENTIL ) (2.5 MG/3ML) 0.083% nebulizer solution Take 3 mLs (2.5 mg total) by nebulization every 6 (six) hours as needed for  wheezing or shortness of breath. 150 mL 1   albuterol  (VENTOLIN  HFA) 108 (90 Base) MCG/ACT inhaler Inhale 1-2 puffs into the lungs every 4 (four) hours as needed for wheezing or shortness of breath. 18 g 5   blood glucose meter kit and supplies Dispense based on patient and insurance preference. Use up to four times daily as directed. (FOR ICD-10 E10.9, E11.9). check glucose fasting in am and TID before meals. 1 each 0   erythromycin  ophthalmic ointment Place 1 Application into the left eye 4 (four) times daily. For 5 days 7 g 0   fluticasone  (FLONASE ) 50 MCG/ACT nasal spray Place 2 sprays into both nostrils daily. 16 g 6   meloxicam  (MOBIC ) 7.5 MG tablet Take 1 tablet (7.5 mg total) by mouth daily. Take with food 30 tablet 1   Spacer/Aero-Holding Chambers (AEROCHAMBER PLUS) inhaler Use with inhaler 1 each 2   tirzepatide  ([name] ) 5 MG/0.5ML Pen Inject 5 mg into the skin once a week. 6 mL 3   No current facility-administered medications on file prior to visit.    ROS see history of present illness  Objective  Physical Exam Vitals:   12/18/23 0820 12/18/23 0839  BP: (!) 150/90 (!) 152/90  Pulse: 85   Temp: 98.3 F (36.8 C)   SpO2: 96%     BP Readings from Last 3 Encounters:  12/18/23 (!) 152/90  10/23/23 130/76  09/26/23 127/80   Wt Readings from Last 3 Encounters:  12/18/23 210 lb 3.2 oz (95.3 kg)  10/23/23 201 lb 9.6 oz (91.4 kg)  09/26/23 214 lb 12.8 oz (97.4 kg)    Physical Exam Constitutional:      General:  He is not in acute distress.    Appearance: Normal appearance.  HENT:     Head: Normocephalic.  Cardiovascular:     Rate and Rhythm: Normal rate and regular rhythm.     Heart sounds: Normal heart sounds.  Pulmonary:     Effort: Pulmonary effort is normal.     Breath sounds: Normal breath sounds. No wheezing.  Skin:    General: Skin is warm and dry.  Neurological:     General: No focal deficit present.     Mental Status: He is alert.  Psychiatric:         Mood and Affect: Mood normal.        Behavior: Behavior normal.    Assessment/Plan: Please see individual problem list.  Mild intermittent asthma with acute exacerbation Assessment & Plan: Symptoms and rescue inhaler use have increased since switching from Symbicort  to a generic due to insurance coverage. Reports include shortness of breath, coughing, and mucus production, with a recent exacerbation leading to near syncope. Lungs clear on exam. Continue Albuterol  as needed and nebulizer treatments. Start Prednisone  40mg  for 5 days while awaiting Symbicort . Communicate with the pharmacy team and PCP for prior authorization for Symbicort . Advise seeking immediate medical attention if oxygen saturation falls below 90%, worsening shortness of breath or difficulty breathing, strict precautions given to patient.   Orders: -     predniSONE ; Take 2 tablets (40 mg total) by mouth daily.  Dispense: 10 tablet; Refill: 0  Elevated blood pressure reading without diagnosis of hypertension Assessment & Plan: Elevated BP reading x 2 today in office, likely due to increased use of albuterol  and nebulizer treatment prior to office visit. Encouraged to check BP at home and contact if remaining consistently elevated over 140/90. Follow up with PCP.     Return if symptoms worsen or fail to improve.   Bluford Burkitt, NP-C [facility]

## 2023-12-17 NOTE — Progress Notes (Signed)
 Insurance Formulary Question:  Symbicort  not covered. Wixela has been less effective for patient.  Please advise on possible alternatives for asthma management LABA/ICS  Insurance Benefits:    Plan  Unable to locate patient's specific formulary though will submit a prior authorization for Symbicort  to see if covered/what preferred agents are.  Prior Authorization Submitted 12/17/23; CMM Prior Auth Key: [id number] Response Received 12/18/23  Request for coverage of Symbicort  171mcg-4.5mcg/Act (budesonide -formot) has been denied. Denial Reason: Non-preferred agent The preferred drugs for your plan are: [budesonide -formoterol , fluticasone -salmeterol (00054- 032X-XX, 00093-751X-XX), Breyna , Wixela Inhub , BREO ELLIPTA  (except certain NDCs)]. Requirement: 3 in a class with 3 or more alternatives. Requirement: 2 in a class with 2 or more alternatives. R  Information has been shared with PCP who shared with patient.   Manuelita FABIENE Kobs, PharmD Clinical Pharmacist [facility] [phone]

## 2023-12-18 ENCOUNTER — Other Ambulatory Visit: Payer: Self-pay | Admitting: Family

## 2023-12-18 ENCOUNTER — Telehealth: Payer: Self-pay

## 2023-12-18 ENCOUNTER — Other Ambulatory Visit (HOSPITAL_COMMUNITY): Payer: Self-pay

## 2023-12-18 ENCOUNTER — Other Ambulatory Visit: Payer: Self-pay

## 2023-12-18 ENCOUNTER — Ambulatory Visit (INDEPENDENT_AMBULATORY_CARE_PROVIDER_SITE_OTHER): Payer: 59 | Admitting: Nurse Practitioner

## 2023-12-18 VITALS — BP 152/90 | HR 85 | Temp 98.3°F | Ht 66.0 in | Wt 210.2 lb

## 2023-12-18 DIAGNOSIS — J4530 Mild persistent asthma, uncomplicated: Secondary | ICD-10-CM

## 2023-12-18 DIAGNOSIS — J4521 Mild intermittent asthma with (acute) exacerbation: Secondary | ICD-10-CM

## 2023-12-18 DIAGNOSIS — R030 Elevated blood-pressure reading, without diagnosis of hypertension: Secondary | ICD-10-CM

## 2023-12-18 MED ORDER — BUDESONIDE-FORMOTEROL FUMARATE 160-4.5 MCG/ACT IN AERO: 2.0000 | INHALATION_SPRAY | Freq: Two times a day (BID) | RESPIRATORY_TRACT | 3 refills | Status: DC

## 2023-12-18 MED ORDER — PREDNISONE 20 MG PO TABS: 40.0000 mg | ORAL_TABLET | Freq: Every day | ORAL | 0 refills | Status: DC

## 2023-12-18 MED ORDER — PREDNISONE 20 MG PO TABS
40.0000 mg | ORAL_TABLET | Freq: Every day | ORAL | 0 refills | Status: DC
Start: 2023-12-18 — End: 2023-12-18

## 2023-12-18 MED ORDER — BUDESONIDE-FORMOTEROL FUMARATE 160-4.5 MCG/ACT IN AERO
2.0000 | INHALATION_SPRAY | Freq: Two times a day (BID) | RESPIRATORY_TRACT | 3 refills | Status: DC
Start: 2023-12-18 — End: 2024-04-13

## 2023-12-18 MED ORDER — FLUTICASONE FUROATE-VILANTEROL 100-25 MCG/ACT IN AEPB: 1.0000 | INHALATION_SPRAY | Freq: Every day | RESPIRATORY_TRACT | 11 refills | Status: DC

## 2023-12-18 NOTE — Assessment & Plan Note (Signed)
 Elevated BP reading x 2 today in office, likely due to increased use of albuterol  and nebulizer treatment prior to office visit. Encouraged to check BP at home and contact if remaining consistently elevated over 140/90. Follow up with PCP.

## 2023-12-18 NOTE — Telephone Encounter (Signed)
 Spoke to [place] at [facility] and they have filled rx and he was able to pick up finally spoke to pt and he is feeling better already.

## 2023-12-18 NOTE — Telephone Encounter (Signed)
 noted

## 2023-12-18 NOTE — Addendum Note (Signed)
 Addended by: Key Cen G on: 12/18/2023 05:17 PM   Modules accepted: Orders

## 2023-12-18 NOTE — Addendum Note (Signed)
 Addended by: Gwenette Wellons on: 12/18/2023 01:41 PM   Modules accepted: Orders

## 2023-12-18 NOTE — Telephone Encounter (Signed)
 Spoke to pt he has finally  picked up Symbicort  from the pharmacy!

## 2023-12-18 NOTE — Telephone Encounter (Signed)
 Copied from CRM [id number]. Topic: Clinical - Prescription Issue >> Dec 18, 2023 11:04 AM Adonis Hoot wrote: Reason for CRM: patient called in returning a call from janetta regarding the PA that was started for medication BUDESONIDE -FORMOTEROL  160-4.5 MCG/ACT INHALER

## 2023-12-18 NOTE — Telephone Encounter (Signed)
 Pharmacy Patient Advocate Encounter   Received notification from Patient Advice Request messages that prior authorization for SYMBICORT  is required/requested.   Insurance verification completed.   The patient is insured through [facility] .   Per test claim: PA required; PA submitted to above mentioned insurance via CoverMyMeds Key/confirmation #/EOC [id number] Status is pending

## 2023-12-18 NOTE — Telephone Encounter (Signed)
 Spoke to pt and he saw Lorice Roof this am he is aware of the $0 copay per test claim for the Symbicort . [facility]  but [facility] Generic but does not have it in stock. Pt would like me to try [facility] in [place] to see if they have Symbicort .will reach back out when I find out if they have it

## 2023-12-18 NOTE — Telephone Encounter (Signed)
 Spoke to pharmacist at [facility] they do have it in stock and they will let him know when he can pick it up. Also spoke to [place]  per pt to have prednisone  sent over as well.

## 2023-12-18 NOTE — Telephone Encounter (Signed)
 Pharmacy Patient Advocate Encounter   Received notification from Patient Advice Request messages that prior authorization for BUDESONIDE -FORMOTEROL  160-4.5 MCG/ACT INHALER is required/requested.   Insurance verification completed.   The patient is insured through [facility] .   Per test claim: The current 30 day co-pay is, $0.  No PA needed at this time. This test claim was processed through [facility]- copay amounts may vary at other pharmacies due to pharmacy/plan contracts, or as the patient moves through the different stages of their insurance plan.

## 2023-12-18 NOTE — Assessment & Plan Note (Addendum)
 Symptoms and rescue inhaler use have increased since switching from Symbicort  to a generic due to insurance coverage. Reports include shortness of breath, coughing, and mucus production, with a recent exacerbation leading to near syncope. Lungs clear on exam. Continue Albuterol  as needed and nebulizer treatments. Start Prednisone  40mg  for 5 days while awaiting Symbicort . Communicate with the pharmacy team and PCP for prior authorization for Symbicort . Advise seeking immediate medical attention if oxygen saturation falls below 90%, worsening shortness of breath or difficulty breathing, strict precautions given to patient.

## 2023-12-18 NOTE — Telephone Encounter (Signed)
SEE PREVIOUS NOTE

## 2023-12-19 ENCOUNTER — Other Ambulatory Visit (HOSPITAL_COMMUNITY): Payer: Self-pay

## 2023-12-30 ENCOUNTER — Ambulatory Visit (INDEPENDENT_AMBULATORY_CARE_PROVIDER_SITE_OTHER): Payer: 59 | Admitting: Family

## 2023-12-30 ENCOUNTER — Encounter: Payer: Self-pay | Admitting: Family

## 2023-12-30 VITALS — BP 130/90 | HR 81 | Temp 97.3°F | Ht 66.0 in | Wt 210.2 lb

## 2023-12-30 DIAGNOSIS — R21 Rash and other nonspecific skin eruption: Secondary | ICD-10-CM | POA: Diagnosis not present

## 2023-12-30 DIAGNOSIS — R7309 Other abnormal glucose: Secondary | ICD-10-CM | POA: Diagnosis not present

## 2023-12-30 DIAGNOSIS — J4530 Mild persistent asthma, uncomplicated: Secondary | ICD-10-CM | POA: Diagnosis not present

## 2023-12-30 DIAGNOSIS — R030 Elevated blood-pressure reading, without diagnosis of hypertension: Secondary | ICD-10-CM

## 2023-12-30 LAB — POCT GLYCOSYLATED HEMOGLOBIN (HGB A1C): Hemoglobin A1C: 6 % — AB (ref 4.0–5.6)

## 2023-12-30 MED ORDER — CLOTRIMAZOLE 1 % EX CREA: 1.0000 | TOPICAL_CREAM | Freq: Two times a day (BID) | CUTANEOUS | 1 refills | Status: DC

## 2023-12-30 NOTE — Assessment & Plan Note (Signed)
Improved as resting at room.  Patient will keep blood pressure log. Patient will monitor at home and send me readings.

## 2023-12-30 NOTE — Assessment & Plan Note (Signed)
Chronic, stable without acute respiratory distress.  No adventitious lung sounds.  Discussed with patient that he requires Symbicort as symptoms significantly worsened on alternative, Wixela. Fortunately, for now insurance is covering Symbicort.  Pending referral to pulmonology.

## 2023-12-30 NOTE — Telephone Encounter (Signed)
Pharmacy Patient Advocate Encounter  Received notification from [facility] that Prior Authorization for [facility] has been CANCELLED due to: Your PA request has been closed. test claim pays for generic and 90 days out   PA #/Case ID/Reference #: [id number]

## 2023-12-30 NOTE — Assessment & Plan Note (Signed)
Presentation consistent with Candida.  Advised to start clotrimazole. He will let me know if does not completely resolve.

## 2023-12-30 NOTE — Patient Instructions (Addendum)
It is imperative that you are seen AT least twice per year for labs and monitoring. Monitor blood pressure at home and me 5-6 reading on separate days. Goal is less than 120/80, based on newest guidelines, however we certainly want to be less than 130/80;  if persistently higher, please make sooner follow up appointment so we can recheck you blood pressure and manage/ adjust medications.

## 2023-12-30 NOTE — Progress Notes (Signed)
Assessment & Plan:  Elevated glucose -     POCT glycosylated hemoglobin (Hb A1C)  Rash of genital area Assessment & Plan: Presentation consistent with Candida.  Advised to start clotrimazole. He will let me know if does not completely resolve.   Orders: -     Clotrimazole; Apply 1 Application topically 2 (two) times daily.  Dispense: 30 g; Refill: 1  Mild persistent asthma without complication Assessment & Plan: Chronic, stable without acute respiratory distress.  No adventitious lung sounds.  Discussed with patient that he requires Symbicort as symptoms significantly worsened on alternative, Wixela. Fortunately, for now insurance is covering Symbicort.  Pending referral to pulmonology.    Elevated blood pressure reading Assessment & Plan: Improved as resting at room.  Patient will keep blood pressure log. Patient will monitor at home and send me readings.       Return precautions given.   Risks, benefits, and alternatives of the medications and treatment plan prescribed today were discussed, and patient expressed understanding.   Education regarding symptom management and diagnosis given to patient on AVS either electronically or printed.  Return in about 6 months (around 06/28/2024).  [name], FNP  Subjective:    Patient ID: Brandon Marsh, male    DOB: Feb 21, 1986, 38 y.o.   MRN: [id number]  CC: Brandon Marsh is a 38 y.o. male who presents today for follow up.   HPI: Feels well today.  No new complaints. He still noticed red rash on tip of his penis.  Nontender.  No blisters.  No concerns for sexual transmitted disease.  He has been using zinc oxide.  Aggravated with working out.  He never tried clotrimazole cream  Breathing well today.  He is feeling much better on Symbicort.   Seen 12/18/2023 for asthma exacerbation.  Started on prednisone 40 mg taper.   Compliant with Zyrtec, Symbicort.  On wixela, he had increased SOB, cough. Blood pressure  elevated and he developed a headache.  Pulse ox dropped to 85%.    As soon as he started Symbicort again immediately he felt better.  Shortness of breath wheezing resolved.   Allergies: [name] [tirzepatide] Current Outpatient Medications on File Prior to Visit  Medication Sig Dispense Refill   albuterol (PROVENTIL) (2.5 MG/3ML) 0.083% nebulizer solution Take 3 mLs (2.5 mg total) by nebulization every 6 (six) hours as needed for wheezing or shortness of breath. 150 mL 1   albuterol (VENTOLIN HFA) 108 (90 Base) MCG/ACT inhaler Inhale 1-2 puffs into the lungs every 4 (four) hours as needed for wheezing or shortness of breath. 18 g 5   blood glucose meter kit and supplies Dispense based on patient and insurance preference. Use up to four times daily as directed. (FOR ICD-10 E10.9, E11.9). check glucose fasting in am and TID before meals. 1 each 0   budesonide-formoterol (SYMBICORT) 160-4.5 MCG/ACT inhaler Inhale 2 puffs into the lungs 2 (two) times daily. 1 each 3   erythromycin ophthalmic ointment Place 1 Application into the left eye 4 (four) times daily. For 5 days 7 g 0   fluticasone (FLONASE) 50 MCG/ACT nasal spray Place 2 sprays into both nostrils daily. 16 g 6   meloxicam (MOBIC) 7.5 MG tablet Take 1 tablet (7.5 mg total) by mouth daily. Take with food 30 tablet 1   Spacer/Aero-Holding Chambers (AEROCHAMBER PLUS) inhaler Use with inhaler 1 each 2   tirzepatide ([name]) 5 MG/0.5ML Pen Inject 5 mg into the skin once a week. 6 mL 3  No current facility-administered medications on file prior to visit.    Review of Systems  Constitutional:  Negative for chills and fever.  Respiratory:  Negative for cough.   Cardiovascular:  Negative for chest pain and palpitations.  Gastrointestinal:  Negative for nausea and vomiting.  Skin:  Positive for rash.      Objective:    BP (!) 130/90   Pulse 81   Temp (!) 97.3 F (36.3 C) (Oral)   Ht 5\' 6"  (1.676 m)   Wt 210 lb 3.2 oz (95.3 kg)    SpO2 99%   BMI 33.93 kg/m  BP Readings from Last 3 Encounters:  12/30/23 (!) 130/90  12/18/23 (!) 152/90  10/23/23 130/76   Wt Readings from Last 3 Encounters:  12/30/23 210 lb 3.2 oz (95.3 kg)  12/18/23 210 lb 3.2 oz (95.3 kg)  10/23/23 201 lb 9.6 oz (91.4 kg)    Physical Exam Vitals reviewed.  Constitutional:      Appearance: He is well-developed.  Cardiovascular:     Rate and Rhythm: Regular rhythm.     Heart sounds: Normal heart sounds.  Pulmonary:     Effort: Pulmonary effort is normal. No respiratory distress.     Breath sounds: Normal breath sounds. No wheezing, rhonchi or rales.  Skin:    General: Skin is warm and dry.     Comments: Glans of penis erythematous serpiginous rash 2cm approx.  No vesicular lesions, discharge  Neurological:     Mental Status: He is alert.  Psychiatric:        Speech: Speech normal.        Behavior: Behavior normal.

## 2024-01-10 ENCOUNTER — Other Ambulatory Visit: Payer: Self-pay | Admitting: Family

## 2024-01-10 DIAGNOSIS — R21 Rash and other nonspecific skin eruption: Secondary | ICD-10-CM

## 2024-01-10 MED ORDER — FLUCONAZOLE 150 MG PO TABS: 150.0000 mg | ORAL_TABLET | Freq: Once | ORAL | 1 refills | Status: AC

## 2024-01-30 ENCOUNTER — Ambulatory Visit: Payer: 59 | Admitting: Internal Medicine

## 2024-01-30 ENCOUNTER — Encounter: Payer: Self-pay | Admitting: Internal Medicine

## 2024-01-30 VITALS — BP 126/96 | HR 88 | Temp 97.6°F | Ht 66.0 in | Wt 211.6 lb

## 2024-01-30 DIAGNOSIS — J4530 Mild persistent asthma, uncomplicated: Secondary | ICD-10-CM | POA: Diagnosis not present

## 2024-01-30 DIAGNOSIS — R0683 Snoring: Secondary | ICD-10-CM

## 2024-01-30 DIAGNOSIS — J45909 Unspecified asthma, uncomplicated: Secondary | ICD-10-CM

## 2024-01-30 LAB — NITRIC OXIDE: Nitric Oxide: 22

## 2024-01-30 MED ORDER — BUDESONIDE-FORMOTEROL FUMARATE 160-4.5 MCG/ACT IN AERO: 2.0000 | INHALATION_SPRAY | Freq: Two times a day (BID) | RESPIRATORY_TRACT | 12 refills | Status: DC

## 2024-01-30 MED ORDER — FLUTICASONE PROPIONATE 50 MCG/ACT NA SUSP: 2.0000 | Freq: Every day | NASAL | 6 refills | Status: DC

## 2024-01-30 NOTE — Progress Notes (Signed)
 [facility] Consultation      Date: 01/30/2024,   MRN# [id number] Brandon Marsh 11/03/1986     CHIEF COMPLAINT:   Assessment for asthma   HISTORY OF PRESENT ILLNESS   38 year old pleasant African-American male seen today for assessment for asthma Diagnosed with childhood asthma at the age of 52 Most of his symptoms were related to sports Patient uses albuterol inhaler prior to exercise and exertion Over the past several years patient was managed with Symbicort His insurance company switched to [employer] he has tried and failed and was very uncontrolled Patient was switched back to Symbicort 160  FENO 21  At this time he has no exacerbations No exacerbation at this time No evidence of heart failure at this time No evidence or signs of infection at this time No respiratory distress No fevers, chills, nausea, vomiting, diarrhea No evidence of lower extremity edema No evidence hemoptysis  Triggers include smoke exercise allergies Patient is not exposed to cats or dogs No food allergies noted  Works at [employer]    Patient is seen today for problems and issues with sleep related to excessive daytime sleepiness Patient  has been having sleep problems for many years Patient has been having excessive daytime sleepiness for a long time Patient has been having extreme fatigue and tiredness, lack of energy +  very Loud snoring every night    Discussed sleep data and reviewed with patient.  Encouraged proper weight management.  Discussed driving precautions and its relationship with hypersomnolence.  Discussed operating dangerous equipment and its relationship with hypersomnolence.  Discussed sleep hygiene, and benefits of a fixed sleep waked time.  The importance of getting eight or more hours of sleep discussed with patient.  Discussed limiting the use of the computer and television before bedtime.  Decrease  naps during the day, so night time sleep will become enhanced.  Limit caffeine, and sleep deprivation.  HTN, stroke, and heart failure are potential risk factors.   Discussed risk of untreated sleep apnea including cardiac arrhthymias, stroke, DM, pulm HTN.    EPWORTH SLEEP SCORE 8   PAST MEDICAL HISTORY   Past Medical History:  Diagnosis Date  . Alopecia   . Anxiety and depression 04/21/2020  . Asthma   . Elevated blood pressure reading without diagnosis of hypertension   . Wrist pain, left 04/21/2020     SURGICAL HISTORY   Past Surgical History:  Procedure Laterality Date  . WISDOM TOOTH EXTRACTION       FAMILY HISTORY   Family History  Problem Relation Age of Onset  . Hypertension Mother   . Asthma Father   . Diabetes Maternal Grandmother   . Hypertension Maternal Grandmother   . Alcohol abuse Paternal Grandmother   . Thyroid cancer Neg Hx      SOCIAL HISTORY   Social History   Tobacco Use  . Smoking status: Never  . Smokeless tobacco: Never  Vaping Use  . Vaping status: Never Used  Substance Use Topics  . Alcohol use: Yes    Alcohol/week: 0.0 standard drinks of alcohol    Comment: rarely  . Drug use: No     MEDICATIONS    Home Medication:  Current Outpatient Rx  . Order #: [id number] Class: Normal  . Order #: [id number] Class: Normal  . Order #: [id number] Class: Print  . Order #: [id number] Class: Normal  . Order #: [id number] Class: Normal  . Order #: [id number] Class: Normal  . Order #:  [id number] Class: Normal  . Order #: [id number] Class: Normal  . Order #: [id number] Class: Normal  . Order #: [id number] Class: Normal    Current Medication:  Current Outpatient Medications:  .  albuterol (PROVENTIL) (2.5 MG/3ML) 0.083% nebulizer solution, Take 3 mLs (2.5 mg total) by nebulization every 6 (six) hours as needed for wheezing or shortness of breath., Disp: 150 mL, Rfl: 1 .  albuterol (VENTOLIN HFA) 108 (90 Base) MCG/ACT inhaler, Inhale 1-2 puffs into the  lungs every 4 (four) hours as needed for wheezing or shortness of breath., Disp: 18 g, Rfl: 5 .  blood glucose meter kit and supplies, Dispense based on patient and insurance preference. Use up to four times daily as directed. (FOR ICD-10 E10.9, E11.9). check glucose fasting in am and TID before meals., Disp: 1 each, Rfl: 0 .  budesonide-formoterol (SYMBICORT) 160-4.5 MCG/ACT inhaler, Inhale 2 puffs into the lungs 2 (two) times daily., Disp: 1 each, Rfl: 3 .  clotrimazole (LOTRIMIN) 1 % cream, Apply 1 Application topically 2 (two) times daily., Disp: 30 g, Rfl: 1 .  erythromycin ophthalmic ointment, Place 1 Application into the left eye 4 (four) times daily. For 5 days, Disp: 7 g, Rfl: 0 .  fluticasone (FLONASE) 50 MCG/ACT nasal spray, Place 2 sprays into both nostrils daily., Disp: 16 g, Rfl: 6 .  meloxicam (MOBIC) 7.5 MG tablet, Take 1 tablet (7.5 mg total) by mouth daily. Take with food, Disp: 30 tablet, Rfl: 1 .  Spacer/Aero-Holding Chambers (AEROCHAMBER PLUS) inhaler, Use with inhaler, Disp: 1 each, Rfl: 2 .  tirzepatide ([name]) 5 MG/0.5ML Pen, Inject 5 mg into the skin once a week., Disp: 6 mL, Rfl: 3    ALLERGIES   [name] [tirzepatide]   BP (!) 126/96 (BP Location: Left Arm, Patient Position: Sitting, Cuff Size: Normal)   Pulse 88   Temp 97.6 F (36.4 C) (Temporal)   Ht 5\' 6"  (1.676 m)   Wt 211 lb 9.6 oz (96 kg)   SpO2 98%   BMI 34.15 kg/m     Review of Systems: Gen:  Denies  fever, sweats, chills weight loss  HEENT: Denies blurred vision, double vision, ear pain, eye pain, hearing loss, nose bleeds, sore throat Cardiac:  No dizziness, chest pain or heaviness, chest tightness,edema, No JVD Resp:   No cough, -sputum production, -shortness of breath,-wheezing, -hemoptysis,  Other:  All other systems negative   Physical Examination:   General Appearance: No distress  EYES PERRLA, EOM intact.   NECK Supple, No JVD Pulmonary: normal breath sounds, No wheezing.   CardiovascularNormal S1,S2.  No m/r/g.   Abdomen: Benign, Soft, non-tender. Neurology UE/LE 5/5 strength, no focal deficits Ext pulses intact, cap refill intact ALL OTHER ROS ARE NEGATIVE     ASSESSMENT/PLAN   38 year old pleasant African-American male with underlying diagnosis of asthma in the setting of possible underlying diagnosis of obstructive sleep apnea with excessive daytime sleepiness and snoring  Asthma moderate but well-controlled Continue Symbicort as prescribed Avoid Allergens and Irritants Avoid secondhand smoke Avoid SICK contacts Recommend  Masking  when appropriate Recommend Keep up-to-date with vaccinations Albuterol as needed  Allergic rhinitis Continue Flonase as prescribed   Excessive daytime sleepiness and unrefreshed sleep Assessed with home sleep study    MEDICATION ADJUSTMENTS/LABS AND TESTS ORDERED: Prescribed Symbicort, Prescribed Flonase Rinse mouth out after every use Use albuterol as needed Obtain Home Sleep Test   CURRENT MEDICATIONS REVIEWED AT LENGTH WITH PATIENT TODAY   Patient  satisfied with Plan of action and management. All  questions answered  Follow up 3 months  I spent a total of 67 minutes reviewing chart data, face-to-face evaluation with the patient, counseling and coordination of care as detailed above.     Lucie Leather, M.D.  [facility]  Medical Director [facility] Medical Director [facility]

## 2024-01-30 NOTE — Patient Instructions (Signed)
 Prescribed Symbicort, Prescribed Flonase Rinse mouth out after every use Use albuterol as needed  Avoid Allergens and Irritants Avoid secondhand smoke Avoid SICK contacts Recommend  Masking  when appropriate Recommend Keep up-to-date with vaccinations  Obtain Home Sleep Test

## 2024-03-03 ENCOUNTER — Encounter: Payer: Self-pay | Admitting: Family

## 2024-03-03 DIAGNOSIS — E1165 Type 2 diabetes mellitus with hyperglycemia: Secondary | ICD-10-CM

## 2024-03-04 MED ORDER — TIRZEPATIDE 5 MG/0.5ML ~~LOC~~ SOAJ: 5.0000 mg | SUBCUTANEOUS | 3 refills | Status: DC

## 2024-04-13 ENCOUNTER — Ambulatory Visit: Admitting: Family Medicine

## 2024-04-13 ENCOUNTER — Encounter: Payer: Self-pay | Admitting: Family Medicine

## 2024-04-13 VITALS — BP 114/76 | HR 80 | Temp 98.4°F | Resp 20 | Ht 66.0 in | Wt 209.5 lb

## 2024-04-13 DIAGNOSIS — Z7985 Long-term (current) use of injectable non-insulin antidiabetic drugs: Secondary | ICD-10-CM

## 2024-04-13 DIAGNOSIS — E118 Type 2 diabetes mellitus with unspecified complications: Secondary | ICD-10-CM | POA: Diagnosis not present

## 2024-04-13 DIAGNOSIS — J4530 Mild persistent asthma, uncomplicated: Secondary | ICD-10-CM | POA: Diagnosis not present

## 2024-04-13 DIAGNOSIS — J069 Acute upper respiratory infection, unspecified: Secondary | ICD-10-CM

## 2024-04-13 DIAGNOSIS — R0982 Postnasal drip: Secondary | ICD-10-CM

## 2024-04-13 LAB — POC COVID19 BINAXNOW: SARS Coronavirus 2 Ag: NEGATIVE

## 2024-04-13 LAB — POC INFLUENZA A&B (BINAX/QUICKVUE)
Influenza A, POC: NEGATIVE
Influenza B, POC: NEGATIVE

## 2024-04-13 LAB — POCT RAPID STREP A (OFFICE): Rapid Strep A Screen: NEGATIVE

## 2024-04-13 MED ORDER — TIRZEPATIDE 7.5 MG/0.5ML ~~LOC~~ SOAJ: 7.5000 mg | SUBCUTANEOUS | 1 refills | Status: DC

## 2024-04-13 MED ORDER — FLUTICASONE PROPIONATE 50 MCG/ACT NA SUSP: 2.0000 | Freq: Two times a day (BID) | NASAL | 6 refills | Status: DC

## 2024-04-13 MED ORDER — LEVOCETIRIZINE DIHYDROCHLORIDE 5 MG PO TABS: 5.0000 mg | ORAL_TABLET | Freq: Every evening | ORAL | 1 refills | Status: AC

## 2024-04-13 MED ORDER — AZELASTINE HCL 0.1 % NA SOLN: 2.0000 | Freq: Two times a day (BID) | NASAL | 12 refills | Status: AC

## 2024-04-13 NOTE — Progress Notes (Signed)
 SUBJECTIVE:   Chief Complaint  Patient presents with   Generalized Body Aches    5 days   Sore Throat   HPI Presetns for acute visit  Discussed the use of AI scribe software for clinical note transcription with the patient, who gave verbal consent to proceed.  History of Present Illness Brandon Marsh "Erla Haw" is a 38 year old male with asthma who presents with body aches, cough, and congestion.  He has been experiencing body aches, trouble sleeping, coughing, and breathing difficulties since Tuesday, with symptoms remaining unchanged. He notes body chills, congestion, and a productive cough with green sputum, which he attributes to postnasal drip. No fever, but he has a runny nose and feels congested.  He has been taking Mucinex for relief, which provides temporary relief for about an hour or two. Initially, he thought his symptoms were due to allergies and has been using Flonase  nasal spray once at night. He is not using any other nasal sprays.  He has a history of asthma and is currently using Symbicort  and Ventolin  inhalers. He reports no recent increase in asthma symptoms and uses albuterol  only for emergencies. He took his Symbicort  inhaler at 8 AM and plans to take it again at night.  He mentions a past diagnosis of diabetes, which he managed to control through weight loss. He is currently on [name] , with his last A1c recorded at 6.0. He has been on a 5 mg dose for a while.  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     PERTINENT PMH / PSH: As above  OBJECTIVE:  BP 114/76   Pulse 80   Temp 98.4 F (36.9 C)   Resp 20   Ht 5\' 6"  (1.676 m)   Wt 209 lb 8 oz (95 kg)   SpO2 98%   BMI 33.81 kg/m    Physical Exam Vitals reviewed.  Constitutional:      General: He is not in acute distress.    Appearance: Normal appearance. He is obese. He is not ill-appearing,  toxic-appearing or diaphoretic.  Eyes:     General:        Right eye: No discharge.        Left eye: No discharge.  Cardiovascular:     Rate and Rhythm: Normal rate and regular rhythm.     Heart sounds: Normal heart sounds.  Pulmonary:     Effort: Pulmonary effort is normal.     Breath sounds: Normal breath sounds.  Abdominal:     General: Bowel sounds are normal.  Musculoskeletal:        General: Normal range of motion.     Cervical back: Normal range of motion.  Skin:    General: Skin is warm and dry.  Neurological:     Mental Status: He is alert and oriented to person, place, and time. Mental status is at baseline.  Psychiatric:        Mood and Affect: Mood normal.        Behavior: Behavior normal.        Thought Content: Thought content normal.        Judgment: Judgment normal.           04/13/2024    2:40 PM 10/23/2023    9:57 AM 09/26/2023    1:46 PM 03/29/2023    1:40 PM 02/27/2023   12:08 PM  Depression  screen PHQ 2/9  Decreased Interest 0 0 0 0 1  Down, Depressed, Hopeless 0 0 0 0 0  PHQ - 2 Score 0 0 0 0 1  Altered sleeping 0 0 0 0 0  Tired, decreased energy 0 0 0 0 0  Change in appetite 0 0 0 0 1  Feeling bad or failure about yourself  0 0 0 0 1  Trouble concentrating 0 0 0 0 0  Moving slowly or fidgety/restless 0 0 0 0 0  Suicidal thoughts 0 0 0 0 0  PHQ-9 Score 0 0 0 0 3  Difficult doing work/chores Not difficult at all Not difficult at all Not difficult at all Not difficult at all Not difficult at all      04/13/2024    2:40 PM 10/23/2023    9:57 AM 09/26/2023    1:46 PM 03/29/2023    1:41 PM  GAD 7 : Generalized Anxiety Score  Nervous, Anxious, on Edge 0 0 0 0  Control/stop worrying 0 0 0 0  Worry too much - different things 0 0 0 0  Trouble relaxing 0 0 0 0  Restless 0 0 0 0  Easily annoyed or irritable 0 0 0 0  Afraid - awful might happen 0 0 0 0  Total GAD 7 Score 0 0 0 0  Anxiety Difficulty Not difficult at all Not difficult at all Not  difficult at all Not difficult at all    ASSESSMENT/PLAN:  Upper respiratory tract infection, unspecified type Assessment & Plan: Likely viral etiology with postnasal drip. No antibiotics needed due to lack of fever and clear lungs. Discussed unnecessary antibiotic risks. - Increase Flonase  to two sprays twice a day. - Add Astelin  spray, two sprays twice a day. - Consider Xyzal  at night for symptomatic relief. - Monitor for fever or worsening symptoms such as wheezing or increased albuterol  use.  Orders: -     POC COVID-19 BinaxNow -     POC Influenza A&B(BINAX/QUICKVUE) -     POCT rapid strep A  Mild persistent asthma without complication Assessment & Plan: Well-controlled with Symbicort  and as-needed albuterol . Lungs clear. Sleep apnea test can proceed. - Continue Symbicort  and as-needed albuterol . - Proceed with sleep apnea test as scheduled.  Orders: -     Fluticasone  Propionate; Place 2 sprays into both nostrils in the morning and at bedtime.  Dispense: 16 g; Refill: 6  Type 2 diabetes mellitus with complications (HCC) Assessment & Plan: Diabetes well-controlled with A1c of 6.0. Significant weight loss. Patient desires [name]  dosage increase. - Increase [name]  to 7.5 mg. - Schedule follow-up in three months. - Provide [name]  refills for three months.  Orders: -     Comprehensive metabolic panel with GFR -     Tirzepatide ; Inject 7.5 mg into the skin once a week.  Dispense: 6 mL; Refill: 1  Post-nasal drip -     Fluticasone  Propionate; Place 2 sprays into both nostrils in the morning and at bedtime.  Dispense: 16 g; Refill: 6 -     Azelastine  HCl; Place 2 sprays into both nostrils 2 (two) times daily. Use in each nostril as directed  Dispense: 30 mL; Refill: 12 -     Levocetirizine Dihydrochloride ; Take 1 tablet (5 mg total) by mouth every evening.  Dispense: 30 tablet; Refill: 1       PDMP reviewed  Return in about 4 weeks (around 05/11/2024) for  PCP.  Valli Gaw, MD

## 2024-04-13 NOTE — Patient Instructions (Addendum)
 It was a pleasure meeting you today. Thank you for allowing me to take part in your health care.  Our goals for today as we discussed include:  We will get some labs today.  If they are abnormal or we need to do something about them, I will call you.  If they are normal, I will send you a message on MyChart (if it is active) or a letter in the mail.  If you don't hear from us  in 2 weeks, please call the office at the number below.   Increase [name]  to 7.5 mg weekly Schedule follow up with PCP in 1 month for diabetic management  COVID, Flu A&B, Strep A throat negative   Increase Flonase  to 2 sprays two times a day Start Astelin 2 sprays two times a day Start Xyzal 5 mg at night  Continue inhalers as prescribed  You can take Tylenol and/or Ibuprofen  as needed for fever reduction and pain relief.   For cough: honey 1/2 to 1 teaspoon (you can dilute the honey in water or another fluid).  You can also use guaifenesin and dextromethorphan for cough. You can use a humidifier for chest congestion and cough.  If you don't have a humidifier, you can sit in the bathroom with the hot shower running.      For sore throat: try warm salt water gargles, cepacol lozenges, throat spray, warm tea or water with lemon/honey, popsicles or ice, or OTC cold relief medicine for throat discomfort.   For congestion: take a daily anti-histamine like Zyrtec , Claritin, and a oral decongestant, such as pseudoephedrine .  You can also use Flonase  1-2 sprays in each nostril daily.   It is important to stay hydrated: drink plenty of fluids (water, gatorade/powerade/pedialyte, juices, or teas) to keep your throat moisturized and help further relieve irritation/discomfort.     This is a list of the screening recommended for you and due dates:  Health Maintenance  Topic Date Due   Eye exam for diabetics  Never done   Pneumococcal Vaccination (1 of 2 - PCV) Never done   COVID-19 Vaccine (4 - 2024-25 season) 08/04/2023    DTaP/Tdap/Td vaccine (2 - Td or Tdap) 08/14/2023   Hemoglobin A1C  06/28/2024   Flu Shot  07/03/2024   Yearly kidney function blood test for diabetes  09/25/2024   Yearly kidney health urinalysis for diabetes  09/25/2024   Complete foot exam   09/25/2024   Hepatitis C Screening  Completed   HIV Screening  Completed   HPV Vaccine  Aged Out   Meningitis B Vaccine  Aged Out    If you have any questions or concerns, please do not hesitate to call the office at [phone].  I look forward to our next visit and until then take care and stay safe.  Regards,   Valli Gaw, MD   [facility]

## 2024-04-14 LAB — COMPREHENSIVE METABOLIC PANEL WITH GFR
ALT: 30 U/L (ref 0–53)
AST: 20 U/L (ref 0–37)
Albumin: 4.7 g/dL (ref 3.5–5.2)
Alkaline Phosphatase: 61 U/L (ref 39–117)
BUN: 13 mg/dL (ref 6–23)
CO2: 29 meq/L (ref 19–32)
Calcium: 9.7 mg/dL (ref 8.4–10.5)
Chloride: 102 meq/L (ref 96–112)
Creatinine, Ser: 1.12 mg/dL (ref 0.40–1.50)
GFR: 84.59 mL/min (ref >60.00)
Glucose, Bld: 81 mg/dL (ref 70–99)
Potassium: 4.1 meq/L (ref 3.5–5.1)
Sodium: 139 meq/L (ref 135–145)
Total Bilirubin: 0.5 mg/dL (ref 0.2–1.2)
Total Protein: 7.3 g/dL (ref 6.0–8.3)

## 2024-04-15 ENCOUNTER — Other Ambulatory Visit: Payer: Self-pay | Admitting: Family Medicine

## 2024-04-15 ENCOUNTER — Ambulatory Visit: Payer: Self-pay

## 2024-04-15 ENCOUNTER — Encounter: Payer: Self-pay | Admitting: Family Medicine

## 2024-04-15 ENCOUNTER — Ambulatory Visit: Payer: Self-pay | Admitting: Family Medicine

## 2024-04-15 MED ORDER — AMOXICILLIN 500 MG PO TABS: 500.0000 mg | ORAL_TABLET | Freq: Two times a day (BID) | ORAL | 0 refills | Status: AC

## 2024-04-15 NOTE — Telephone Encounter (Signed)
  Chief Complaint: cough, productive Symptoms: sore throat, productive cough,  Frequency: about a week Disposition: [] ED /[] Urgent Care (no appt availability in office) / [x] Appointment(In office/virtual)/ []  [place] [facility]/ [] Home Care/ [x] Refused Recommended Disposition /[] [facility]/ []  Follow-up with PCP Additional Notes: pt states that he was seen by his pcp on Monday. Was told that if s/s worsened he could have an abx. Pt states that s/s have worsened. Pt stated he is now having productive cough, body aches, chills. Pt states that he was told on Monday that he could get abx if things progressed, pt states that he believes that it has progressed and would like to move fwd with abx. Pt declined appt at this time.   Copied from CRM [id number]. Topic: Clinical - Red Word Triage >> Apr 15, 2024 11:38 AM Luane Rumps D wrote: Red Word that prompted transfer to Nurse Triage: Green mucus, sore throat + losing voice. Body aches/chills, was seen on Monday but symptoms have gotten worse. Reason for Disposition  SEVERE coughing spells (e.g., whooping sound after coughing, vomiting after coughing)  Answer Assessment - Initial Assessment Questions 1. ONSET: "When did the cough begin?"      1 week 2. SEVERITY: "How bad is the cough today?"      severe 3. SPUTUM: "Describe the color of your sputum" (none, dry cough; clear, white, yellow, green)     Greenish/yellow 4. HEMOPTYSIS: "Are you coughing up any blood?" If so ask: "How much?" (flecks, streaks, tablespoons, etc.)     denies 5. DIFFICULTY BREATHING: "Are you having difficulty breathing?" If Yes, ask: "How bad is it?" (e.g., mild, moderate, severe)    - MILD: No SOB at rest, mild SOB with walking, speaks normally in sentences, can lie down, no retractions, pulse < 100.    - MODERATE: SOB at rest, SOB with minimal exertion and prefers to sit, cannot lie down flat, speaks in phrases, mild retractions, audible wheezing, pulse 100-120.     - SEVERE: Very SOB at rest, speaks in single words, struggling to breathe, sitting hunched forward, retractions, pulse > 120      mild 6. FEVER: "Do you have a fever?" If Yes, ask: "What is your temperature, how was it measured, and when did it start?"     Denies taking temp 7. CARDIAC HISTORY: "Do you have any history of heart disease?" (e.g., heart attack, congestive heart failure)      Denies  8. LUNG HISTORY: "Do you have any history of lung disease?"  (e.g., pulmonary embolus, asthma, emphysema) asthma 9. PE RISK FACTORS: "Do you have a history of blood clots?" (or: recent major surgery, recent prolonged travel, bedridden)     denies 10. OTHER SYMPTOMS: "Do you have any other symptoms?" (e.g., runny nose, wheezing, chest pain)       Runny nose 12. TRAVEL: "Have you traveled out of the country in the last month?" (e.g., travel history, exposures)       denies  Protocols used: Cough - Acute Productive-A-AH

## 2024-04-15 NOTE — Telephone Encounter (Signed)
 Pt called back to stated that he isn't getting better and would like to have the abx sent in.

## 2024-04-15 NOTE — Telephone Encounter (Signed)
 Called pt and let him know that RX was sent to pharmacy.

## 2024-04-19 ENCOUNTER — Encounter: Payer: Self-pay | Admitting: Family Medicine

## 2024-04-19 DIAGNOSIS — E118 Type 2 diabetes mellitus with unspecified complications: Secondary | ICD-10-CM | POA: Insufficient documentation

## 2024-04-19 DIAGNOSIS — J069 Acute upper respiratory infection, unspecified: Secondary | ICD-10-CM | POA: Insufficient documentation

## 2024-04-19 DIAGNOSIS — R0982 Postnasal drip: Secondary | ICD-10-CM | POA: Insufficient documentation

## 2024-04-19 NOTE — Assessment & Plan Note (Signed)
 Likely viral etiology with postnasal drip. No antibiotics needed due to lack of fever and clear lungs. Discussed unnecessary antibiotic risks. - Increase Flonase  to two sprays twice a day. - Add Astelin  spray, two sprays twice a day. - Consider Xyzal  at night for symptomatic relief. - Monitor for fever or worsening symptoms such as wheezing or increased albuterol  use.

## 2024-04-19 NOTE — Assessment & Plan Note (Signed)
 Well-controlled with Symbicort  and as-needed albuterol . Lungs clear. Sleep apnea test can proceed. - Continue Symbicort  and as-needed albuterol . - Proceed with sleep apnea test as scheduled.

## 2024-04-19 NOTE — Assessment & Plan Note (Signed)
 Diabetes well-controlled with A1c of 6.0. Significant weight loss. Patient desires [name]  dosage increase. - Increase [name]  to 7.5 mg. - Schedule follow-up in three months. - Provide [name]  refills for three months.

## 2024-04-28 ENCOUNTER — Ambulatory Visit: Payer: 59 | Admitting: Internal Medicine

## 2024-04-28 NOTE — Progress Notes (Deleted)
 [facility] [place] Consultation      Date: 04/28/2024,   MRN# [id number] Brandon Marsh January 22, 1986     CHIEF COMPLAINT:   Assessment for asthma   HISTORY OF PRESENT ILLNESS   38 year old pleasant African-American male seen today for assessment for asthma Diagnosed with childhood asthma at the age of 86 Most of his symptoms were related to sports Patient uses albuterol  inhaler prior to exercise and exertion Over the past several years patient was managed with Symbicort  His insurance company switched to [employer] he has tried and failed and was very uncontrolled Patient was switched back to Symbicort  160  FENO 21  At this time he has no exacerbations No exacerbation at this time No evidence of heart failure at this time No evidence or signs of infection at this time No respiratory distress No fevers, chills, nausea, vomiting, diarrhea No evidence of lower extremity edema No evidence hemoptysis  Triggers include smoke exercise allergies Patient is not exposed to cats or dogs No food allergies noted  Works at [employer]    Patient is seen today for problems and issues with sleep related to excessive daytime sleepiness Patient  has been having sleep problems for many years Patient has been having excessive daytime sleepiness for a long time Patient has been having extreme fatigue and tiredness, lack of energy +  very Loud snoring every night    Discussed sleep data and reviewed with patient.  Encouraged proper weight management.  Discussed driving precautions and its relationship with hypersomnolence.  Discussed operating dangerous equipment and its relationship with hypersomnolence.  Discussed sleep hygiene, and benefits of a fixed sleep waked time.  The importance of getting eight or more hours of sleep discussed with patient.  Discussed limiting the use of the computer and television before bedtime.  Decrease  naps during the day, so night time sleep will become enhanced.  Limit caffeine, and sleep deprivation.  HTN, stroke, and heart failure are potential risk factors.   Discussed risk of untreated sleep apnea including cardiac arrhthymias, stroke, DM, pulm HTN.    EPWORTH SLEEP SCORE 8   PAST MEDICAL HISTORY   Past Medical History:  Diagnosis Date   Alopecia    Anxiety and depression 04/21/2020   Asthma    Elevated blood pressure reading without diagnosis of hypertension    Wrist pain, left 04/21/2020     SURGICAL HISTORY   Past Surgical History:  Procedure Laterality Date   WISDOM TOOTH EXTRACTION       FAMILY HISTORY   Family History  Problem Relation Age of Onset   Hypertension Mother    Asthma Father    Diabetes Maternal Grandmother    Hypertension Maternal Grandmother    Alcohol abuse Paternal Grandmother    Thyroid cancer Neg Hx      SOCIAL HISTORY   Social History   Tobacco Use   Smoking status: Never   Smokeless tobacco: Never  Vaping Use   Vaping status: Never Used  Substance Use Topics   Alcohol use: Yes    Alcohol/week: 0.0 standard drinks of alcohol    Comment: rarely   Drug use: No     MEDICATIONS    Home Medication:  Current Outpatient Rx   Order #: [id number] Class: Normal   Order #: [id number] Class: Normal   Order #: [id number] Class: Normal   Order #: [id number] Class: Print   Order #: [id number] Class: Normal   Order #: [id number] Class: Normal   Order #:  [id number] Class: Normal   Order #: [id number] Class: Normal   Order #: [id number] Class: Normal   Order #: [id number] Class: Normal    Current Medication:  Current Outpatient Medications:    albuterol  (PROVENTIL ) (2.5 MG/3ML) 0.083% nebulizer solution, Take 3 mLs (2.5 mg total) by nebulization every 6 (six) hours as needed for wheezing or shortness of breath., Disp: 150 mL, Rfl: 1   albuterol  (VENTOLIN  HFA) 108 (90 Base) MCG/ACT inhaler, Inhale 1-2 puffs into the lungs every 4 (four) hours as  needed for wheezing or shortness of breath., Disp: 18 g, Rfl: 5   azelastine  (ASTELIN ) 0.1 % nasal spray, Place 2 sprays into both nostrils 2 (two) times daily. Use in each nostril as directed, Disp: 30 mL, Rfl: 12   blood glucose meter kit and supplies, Dispense based on patient and insurance preference. Use up to four times daily as directed. (FOR ICD-10 E10.9, E11.9). check glucose fasting in am and TID before meals., Disp: 1 each, Rfl: 0   budesonide -formoterol  (SYMBICORT ) 160-4.5 MCG/ACT inhaler, Inhale 2 puffs into the lungs 2 (two) times daily., Disp: 1 each, Rfl: 12   fluticasone  (FLONASE ) 50 MCG/ACT nasal spray, Place 2 sprays into both nostrils in the morning and at bedtime., Disp: 16 g, Rfl: 6   levocetirizine (XYZAL ) 5 MG tablet, Take 1 tablet (5 mg total) by mouth every evening., Disp: 30 tablet, Rfl: 1   meloxicam  (MOBIC ) 7.5 MG tablet, Take 1 tablet (7.5 mg total) by mouth daily. Take with food, Disp: 30 tablet, Rfl: 1   Spacer/Aero-Holding Chambers (AEROCHAMBER PLUS) inhaler, Use with inhaler, Disp: 1 each, Rfl: 2   tirzepatide  ([name] ) 7.5 MG/0.5ML Pen, Inject 7.5 mg into the skin once a week., Disp: 6 mL, Rfl: 1    ALLERGIES   Wixela inhub  [fluticasone -salmeterol]   There were no vitals taken for this visit.    Review of Systems: Gen:  Denies  fever, sweats, chills weight loss  HEENT: Denies blurred vision, double vision, ear pain, eye pain, hearing loss, nose bleeds, sore throat Cardiac:  No dizziness, chest pain or heaviness, chest tightness,edema, No JVD Resp:   No cough, -sputum production, -shortness of breath,-wheezing, -hemoptysis,  Other:  All other systems negative   Physical Examination:   General Appearance: No distress  EYES PERRLA, EOM intact.   NECK Supple, No JVD Pulmonary: normal breath sounds, No wheezing.  CardiovascularNormal S1,S2.  No m/r/g.   Abdomen: Benign, Soft, non-tender. Neurology UE/LE 5/5 strength, no focal deficits Ext pulses  intact, cap refill intact ALL OTHER ROS ARE NEGATIVE     ASSESSMENT/PLAN   38 year old pleasant African-American male with underlying diagnosis of asthma in the setting of possible underlying diagnosis of obstructive sleep apnea with excessive daytime sleepiness and snoring  Asthma moderate but well-controlled Continue Symbicort  as prescribed Avoid Allergens and Irritants Avoid secondhand smoke Avoid SICK contacts Recommend  Masking  when appropriate Recommend Keep up-to-date with vaccinations Albuterol  as needed  Allergic rhinitis Continue Flonase  as prescribed   Excessive daytime sleepiness and unrefreshed sleep Assessed with home sleep study    MEDICATION ADJUSTMENTS/LABS AND TESTS ORDERED: Prescribed Symbicort , Prescribed Flonase  Rinse mouth out after every use Use albuterol  as needed Obtain Home Sleep Test   CURRENT MEDICATIONS REVIEWED AT LENGTH WITH PATIENT TODAY   Patient  satisfied with Plan of action and management. All questions answered  Follow up 3 months  I spent a total of 67 minutes reviewing chart data, face-to-face evaluation with the patient, counseling and coordination of care as detailed  above.     Lady Pier, M.D.  [facility]  Medical Director [facility] Medical Director [facility] Cardio-Pulmonary Department

## 2024-05-14 ENCOUNTER — Encounter: Payer: Self-pay | Admitting: Family

## 2024-05-14 ENCOUNTER — Ambulatory Visit (INDEPENDENT_AMBULATORY_CARE_PROVIDER_SITE_OTHER): Admitting: Family

## 2024-05-14 VITALS — BP 130/70 | HR 81 | Temp 98.2°F | Ht 66.0 in | Wt 205.8 lb

## 2024-05-14 DIAGNOSIS — K439 Ventral hernia without obstruction or gangrene: Secondary | ICD-10-CM | POA: Diagnosis not present

## 2024-05-14 DIAGNOSIS — H65113 Acute and subacute allergic otitis media (mucoid) (sanguinous) (serous), bilateral: Secondary | ICD-10-CM | POA: Diagnosis not present

## 2024-05-14 DIAGNOSIS — R1900 Intra-abdominal and pelvic swelling, mass and lump, unspecified site: Secondary | ICD-10-CM | POA: Diagnosis not present

## 2024-05-14 MED ORDER — PREDNISONE 10 MG PO TABS
ORAL_TABLET | ORAL | 0 refills | Status: AC
Start: 2024-05-14 — End: ?

## 2024-05-14 NOTE — Assessment & Plan Note (Signed)
 Consistent with ventral hernia.Will monitor for now.

## 2024-05-14 NOTE — Patient Instructions (Addendum)
 Question whether lipoma or base of ribcage ( xiphoid process). Refrain from palpation If tenderness persists, let me know as discussed so we can pursue imaging  Start prednisone  tomorrow morning as it can interfere with sleep

## 2024-05-14 NOTE — Assessment & Plan Note (Signed)
 Recent URI. Completed amoxicillin . C/w eustachian tube dysfunction.  Advised prednisone  taper.  Counseled on side effects.

## 2024-05-14 NOTE — Assessment & Plan Note (Signed)
 Tender area of xiphoid process x 3 days. No symptoms to suggest infection, abscess.  Alternatively lipoma Question if xiphoid process it itself which he has appreciated and irritated with palpation. Discussed pursing imaging if tenderness persists in the next week.

## 2024-05-14 NOTE — Progress Notes (Signed)
 Assessment & Plan:  Non-recurrent acute allergic otitis media of both ears Assessment & Plan: Recent URI. Completed amoxicillin . C/w eustachian tube dysfunction.  Advised prednisone  taper.  Counseled on side effects.  Orders: -     predniSONE ; Take 40 mg by mouth on day 1, then taper 10 mg daily until gone  Dispense: 10 tablet; Refill: 0  Abdominal mass, unspecified abdominal location Assessment & Plan: Tender area of xiphoid process x 3 days. No symptoms to suggest infection, abscess.  Alternatively lipoma Question if xiphoid process it itself which he has appreciated and irritated with palpation. Discussed pursing imaging if tenderness persists in the next week.     Ventral hernia without obstruction or gangrene Assessment & Plan: Consistent with ventral hernia.Will monitor for now.      Return precautions given.   Risks, benefits, and alternatives of the medications and treatment plan prescribed today were discussed, and patient expressed understanding.   Education regarding symptom management and diagnosis given to patient on AVS either electronically or printed.  No follow-ups on file.  Bascom Bossier, FNP  Subjective:    Patient ID: Brandon Marsh, male    DOB: September 07, 1986, 38 y.o.   MRN: [id number]  CC: Brandon Marsh is a 38 y.o. male who presents today for an acute visit.    HPI: Complains of BL ear pain x 3 days, unchanged.   Thought water at first and used rubbing alcohol and q tip without improvement  Denies SOB, CP, sob, cough wheezing, sinus pressure, drainage from the ears  Compliant flonase .   Seen 04/13/24 for URI sxs Started azelastine , xyzal , flonase  ; he never started xyzal  or azelastine .   Later called in regards to sore throat , treated with amoxicillin  500mg  BID x 5 days, completed resolved.    He noticed in a 'crunch' position a bulge in the stomach with a mildly tender to the touch knot above x 3 days ago.   No purulent  discharge, papule  No constipation.   He has been on prednisone  in the past.   Allergies: Wixela inhub  [fluticasone -salmeterol] Current Outpatient Medications on File Prior to Visit  Medication Sig Dispense Refill   albuterol  (PROVENTIL ) (2.5 MG/3ML) 0.083% nebulizer solution Take 3 mLs (2.5 mg total) by nebulization every 6 (six) hours as needed for wheezing or shortness of breath. 150 mL 1   albuterol  (VENTOLIN  HFA) 108 (90 Base) MCG/ACT inhaler Inhale 1-2 puffs into the lungs every 4 (four) hours as needed for wheezing or shortness of breath. 18 g 5   azelastine  (ASTELIN ) 0.1 % nasal spray Place 2 sprays into both nostrils 2 (two) times daily. Use in each nostril as directed 30 mL 12   blood glucose meter kit and supplies Dispense based on patient and insurance preference. Use up to four times daily as directed. (FOR ICD-10 E10.9, E11.9). check glucose fasting in am and TID before meals. 1 each 0   budesonide -formoterol  (SYMBICORT ) 160-4.5 MCG/ACT inhaler Inhale 2 puffs into the lungs 2 (two) times daily. 1 each 12   fluticasone  (FLONASE ) 50 MCG/ACT nasal spray Place 2 sprays into both nostrils in the morning and at bedtime. 16 g 6   levocetirizine (XYZAL ) 5 MG tablet Take 1 tablet (5 mg total) by mouth every evening. 30 tablet 1   meloxicam  (MOBIC ) 7.5 MG tablet Take 1 tablet (7.5 mg total) by mouth daily. Take with food 30 tablet 1   Spacer/Aero-Holding Chambers (AEROCHAMBER PLUS) inhaler Use with inhaler 1 each 2  tirzepatide  ([name] ) 7.5 MG/0.5ML Pen Inject 7.5 mg into the skin once a week. 6 mL 1   No current facility-administered medications on file prior to visit.    Review of Systems  Constitutional:  Negative for chills and fever.  HENT:  Positive for ear pain. Negative for congestion.   Respiratory:  Negative for cough.   Cardiovascular:  Negative for chest pain and palpitations.  Gastrointestinal:  Negative for abdominal pain, constipation, nausea and vomiting.       Objective:    BP 130/70   Pulse 81   Temp 98.2 F (36.8 C) (Oral)   Ht 5' 6 (1.676 m)   Wt 205 lb 12.8 oz (93.4 kg)   SpO2 98%   BMI 33.22 kg/m   BP Readings from Last 3 Encounters:  05/14/24 130/70  04/13/24 114/76  01/30/24 (!) 126/96   Wt Readings from Last 3 Encounters:  05/14/24 205 lb 12.8 oz (93.4 kg)  04/13/24 209 lb 8 oz (95 kg)  01/30/24 211 lb 9.6 oz (96 kg)    Physical Exam Vitals reviewed.  Constitutional:      Appearance: He is well-developed.  HENT:     Head: Normocephalic and atraumatic.     Right Ear: Hearing, tympanic membrane, ear canal and external ear normal. No decreased hearing noted. No drainage, swelling or tenderness. No middle ear effusion. Tympanic membrane is not injected, erythematous or bulging.     Left Ear: Hearing, tympanic membrane, ear canal and external ear normal. No decreased hearing noted. No drainage, swelling or tenderness.  No middle ear effusion. Tympanic membrane is not injected, erythematous or bulging.     Nose: Nose normal.     Right Sinus: No maxillary sinus tenderness or frontal sinus tenderness.     Left Sinus: No maxillary sinus tenderness or frontal sinus tenderness.     Mouth/Throat:     Pharynx: Uvula midline. No oropharyngeal exudate or posterior oropharyngeal erythema.     Tonsils: No tonsillar abscesses.   Eyes:     Conjunctiva/sclera: Conjunctivae normal.    Cardiovascular:     Rate and Rhythm: Regular rhythm.     Heart sounds: Normal heart sounds.  Pulmonary:     Effort: Pulmonary effort is normal. No respiratory distress.     Breath sounds: Normal breath sounds. No wheezing, rhonchi or rales.  Abdominal:     Comments: Nickel sized area of mild tenderness, somewhat dense over xiphoid.  Ventral hernia , easily reducible, seen when moving from supine to sitting  Lymphadenopathy:     Head:     Right side of head: No submental, submandibular, tonsillar, preauricular, posterior auricular or occipital  adenopathy.     Left side of head: No submental, submandibular, tonsillar, preauricular, posterior auricular or occipital adenopathy.     Cervical: No cervical adenopathy.   Skin:    General: Skin is warm and dry.   Neurological:     Mental Status: He is alert.   Psychiatric:        Speech: Speech normal.        Behavior: Behavior normal.

## 2024-05-31 ENCOUNTER — Encounter: Payer: Self-pay | Admitting: Family

## 2024-06-12 ENCOUNTER — Other Ambulatory Visit: Payer: Self-pay | Admitting: Family

## 2024-06-12 DIAGNOSIS — R0982 Postnasal drip: Secondary | ICD-10-CM

## 2024-06-12 DIAGNOSIS — J4530 Mild persistent asthma, uncomplicated: Secondary | ICD-10-CM

## 2024-06-12 DIAGNOSIS — K439 Ventral hernia without obstruction or gangrene: Secondary | ICD-10-CM

## 2024-06-12 DIAGNOSIS — J45909 Unspecified asthma, uncomplicated: Secondary | ICD-10-CM

## 2024-06-12 MED ORDER — BUDESONIDE-FORMOTEROL FUMARATE 160-4.5 MCG/ACT IN AERO: 2.0000 | INHALATION_SPRAY | Freq: Two times a day (BID) | RESPIRATORY_TRACT | 12 refills | Status: DC

## 2024-06-12 MED ORDER — FLUTICASONE PROPIONATE 50 MCG/ACT NA SUSP: 2.0000 | Freq: Two times a day (BID) | NASAL | 6 refills | Status: AC

## 2024-06-22 ENCOUNTER — Ambulatory Visit
Admission: RE | Admit: 2024-06-22 | Discharge: 2024-06-22 | Disposition: A | Discharge status: Home / Self Care | Source: Ambulatory Visit | Attending: Family | Admitting: Family

## 2024-06-22 DIAGNOSIS — K439 Ventral hernia without obstruction or gangrene: Secondary | ICD-10-CM | POA: Insufficient documentation

## 2024-06-26 ENCOUNTER — Ambulatory Visit: Payer: Self-pay | Admitting: Family

## 2024-07-31 ENCOUNTER — Other Ambulatory Visit: Payer: Self-pay

## 2024-07-31 ENCOUNTER — Encounter: Payer: Self-pay | Admitting: Family

## 2024-07-31 DIAGNOSIS — J4530 Mild persistent asthma, uncomplicated: Secondary | ICD-10-CM

## 2024-07-31 DIAGNOSIS — J45909 Unspecified asthma, uncomplicated: Secondary | ICD-10-CM

## 2024-07-31 MED ORDER — ALBUTEROL SULFATE (2.5 MG/3ML) 0.083% IN NEBU
2.5000 mg | INHALATION_SOLUTION | Freq: Four times a day (QID) | RESPIRATORY_TRACT | 1 refills | Status: AC | PRN
Start: 2024-07-31 — End: ?

## 2024-07-31 MED ORDER — BUDESONIDE-FORMOTEROL FUMARATE 160-4.5 MCG/ACT IN AERO: 2.0000 | INHALATION_SPRAY | Freq: Two times a day (BID) | RESPIRATORY_TRACT | 12 refills | Status: AC

## 2024-08-26 ENCOUNTER — Encounter: Payer: Self-pay | Admitting: Family

## 2024-08-27 NOTE — Telephone Encounter (Signed)
 Lvm okay to relay

## 2024-10-23 ENCOUNTER — Ambulatory Visit
Admission: EM | Admit: 2024-10-23 | Discharge: 2024-10-23 | Disposition: A | Discharge status: Home / Self Care | Attending: Emergency Medicine | Admitting: Emergency Medicine

## 2024-10-23 DIAGNOSIS — R051 Acute cough: Secondary | ICD-10-CM

## 2024-10-23 DIAGNOSIS — J069 Acute upper respiratory infection, unspecified: Secondary | ICD-10-CM | POA: Insufficient documentation

## 2024-10-23 MED ORDER — PROMETHAZINE-DM 6.25-15 MG/5ML PO SYRP: 5.0000 mL | ORAL_SOLUTION | Freq: Four times a day (QID) | ORAL | 0 refills | Status: AC | PRN

## 2024-10-23 MED ORDER — DOXYCYCLINE HYCLATE 100 MG PO CAPS: 100.0000 mg | ORAL_CAPSULE | Freq: Two times a day (BID) | ORAL | 0 refills | Status: AC

## 2024-10-23 NOTE — Discharge Instructions (Signed)
 Take antibiotic as directed(doxycycline ). Take cough med as prescribed, drowsiness precautions. Rest,push fluids. May use OTC meds of choice(Mucinex, tylenol,etc) as label directed. Follow up with PCP in 3 days if no improvement,sooner if worse.

## 2024-10-23 NOTE — ED Triage Notes (Signed)
 Pt c/o nasal congestion, body aches, cough and sore throat x3weeks
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
## 2024-12-09 ENCOUNTER — Encounter: Payer: Self-pay | Admitting: Family

## 2024-12-09 DIAGNOSIS — E118 Type 2 diabetes mellitus with unspecified complications: Secondary | ICD-10-CM

## 2024-12-09 MED ORDER — TIRZEPATIDE 7.5 MG/0.5ML ~~LOC~~ SOAJ: 7.5000 mg | SUBCUTANEOUS | 1 refills | Status: AC
